# Patient Record
Sex: Male | Born: 1985 | Race: Black or African American | Hispanic: No | Marital: Single | Smoking: Current every day smoker
Health system: Southern US, Community
[De-identification: ages and names within clinical notes are randomized; demographics above are authoritative.]

## PROBLEM LIST (undated history)

## (undated) DIAGNOSIS — R7611 Nonspecific reaction to tuberculin skin test without active tuberculosis: Secondary | ICD-10-CM

## (undated) DIAGNOSIS — B86 Scabies: Secondary | ICD-10-CM

## (undated) HISTORY — DX: Nonspecific reaction to tuberculin skin test without active tuberculosis: R76.11

---
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
## 2006-02-22 ENCOUNTER — Emergency Department (HOSPITAL_COMMUNITY): Admission: EM | Admit: 2006-02-22 | Discharge: 2006-02-22 | Payer: Self-pay | Admitting: Emergency Medicine

## 2006-03-27 ENCOUNTER — Emergency Department (HOSPITAL_COMMUNITY): Admission: EM | Admit: 2006-03-27 | Discharge: 2006-03-27 | Payer: Self-pay | Admitting: Emergency Medicine

## 2006-06-14 ENCOUNTER — Emergency Department (HOSPITAL_COMMUNITY): Admission: EM | Admit: 2006-06-14 | Discharge: 2006-06-14 | Payer: Self-pay | Admitting: Emergency Medicine

## 2006-08-03 ENCOUNTER — Emergency Department (HOSPITAL_COMMUNITY): Admission: EM | Admit: 2006-08-03 | Discharge: 2006-08-03 | Payer: Self-pay | Admitting: *Deleted

## 2006-12-12 ENCOUNTER — Ambulatory Visit (HOSPITAL_COMMUNITY): Admission: RE | Admit: 2006-12-12 | Discharge: 2006-12-12 | Payer: Self-pay | Admitting: Occupational Medicine

## 2007-05-22 ENCOUNTER — Ambulatory Visit: Payer: Self-pay | Admitting: Internal Medicine

## 2007-05-22 ENCOUNTER — Encounter (INDEPENDENT_AMBULATORY_CARE_PROVIDER_SITE_OTHER): Payer: Self-pay | Admitting: *Deleted

## 2007-05-23 LAB — CONVERTED CEMR LAB
ALT: 14 units/L (ref 0–53)
AST: 17 units/L (ref 0–37)
Albumin: 5.2 g/dL (ref 3.5–5.2)
BUN: 16 mg/dL (ref 6–23)
Benzodiazepines.: NEGATIVE
CO2: 22 meq/L (ref 19–32)
Calcium: 10.4 mg/dL (ref 8.4–10.5)
Chloride: 105 meq/L (ref 96–112)
Marijuana Metabolite: POSITIVE — AB
Methadone: NEGATIVE
Opiates: NEGATIVE
Potassium: 4.8 meq/L (ref 3.5–5.3)
Propoxyphene: NEGATIVE

## 2007-05-25 ENCOUNTER — Encounter (INDEPENDENT_AMBULATORY_CARE_PROVIDER_SITE_OTHER): Payer: Self-pay | Admitting: *Deleted

## 2007-05-28 ENCOUNTER — Telehealth (INDEPENDENT_AMBULATORY_CARE_PROVIDER_SITE_OTHER): Payer: Self-pay | Admitting: *Deleted

## 2007-05-30 ENCOUNTER — Encounter: Payer: Self-pay | Admitting: *Deleted

## 2007-05-30 ENCOUNTER — Encounter (INDEPENDENT_AMBULATORY_CARE_PROVIDER_SITE_OTHER): Payer: Self-pay | Admitting: *Deleted

## 2007-05-30 ENCOUNTER — Emergency Department (HOSPITAL_COMMUNITY): Admission: EM | Admit: 2007-05-30 | Discharge: 2007-05-31 | Payer: Self-pay | Admitting: Emergency Medicine

## 2007-12-17 ENCOUNTER — Encounter: Payer: Self-pay | Admitting: Internal Medicine

## 2007-12-17 ENCOUNTER — Ambulatory Visit: Payer: Self-pay | Admitting: Infectious Diseases

## 2007-12-17 LAB — CONVERTED CEMR LAB

## 2008-07-21 ENCOUNTER — Encounter (INDEPENDENT_AMBULATORY_CARE_PROVIDER_SITE_OTHER): Payer: Self-pay | Admitting: *Deleted

## 2008-07-21 ENCOUNTER — Ambulatory Visit: Payer: Self-pay | Admitting: Internal Medicine

## 2008-07-21 DIAGNOSIS — L259 Unspecified contact dermatitis, unspecified cause: Secondary | ICD-10-CM

## 2008-07-21 DIAGNOSIS — F17200 Nicotine dependence, unspecified, uncomplicated: Secondary | ICD-10-CM

## 2008-07-21 DIAGNOSIS — R300 Dysuria: Secondary | ICD-10-CM

## 2008-07-23 LAB — CONVERTED CEMR LAB
Chlamydia, Swab/Urine, PCR: NEGATIVE
GC Probe Amp, Urine: NEGATIVE
Leukocytes, UA: NEGATIVE
Nitrite: NEGATIVE
Protein, ur: NEGATIVE mg/dL
Urobilinogen, UA: 0.2 (ref 0.0–1.0)

## 2009-02-17 ENCOUNTER — Ambulatory Visit: Payer: Self-pay | Admitting: Internal Medicine

## 2009-02-17 DIAGNOSIS — S025XXA Fracture of tooth (traumatic), initial encounter for closed fracture: Secondary | ICD-10-CM | POA: Insufficient documentation

## 2009-02-18 LAB — CONVERTED CEMR LAB: Chlamydia, Swab/Urine, PCR: NEGATIVE

## 2009-07-22 ENCOUNTER — Ambulatory Visit: Payer: Self-pay | Admitting: Infectious Diseases

## 2009-07-22 LAB — CONVERTED CEMR LAB

## 2009-10-10 ENCOUNTER — Emergency Department (HOSPITAL_COMMUNITY): Admission: EM | Admit: 2009-10-10 | Discharge: 2009-10-10 | Payer: Self-pay | Admitting: Family Medicine

## 2009-10-22 ENCOUNTER — Ambulatory Visit: Payer: Self-pay | Admitting: Internal Medicine

## 2009-10-22 LAB — CONVERTED CEMR LAB
Chlamydia, Swab/Urine, PCR: NEGATIVE
GC Probe Amp, Urine: NEGATIVE

## 2010-02-19 ENCOUNTER — Ambulatory Visit: Payer: Self-pay | Admitting: Internal Medicine

## 2010-04-25 ENCOUNTER — Emergency Department (HOSPITAL_COMMUNITY)
Admission: EM | Admit: 2010-04-25 | Discharge: 2010-04-25 | Payer: Self-pay | Source: Home / Self Care | Admitting: Family Medicine

## 2010-06-07 ENCOUNTER — Emergency Department (HOSPITAL_COMMUNITY)
Admission: EM | Admit: 2010-06-07 | Discharge: 2010-06-07 | Payer: Self-pay | Source: Home / Self Care | Admitting: Emergency Medicine

## 2010-06-09 LAB — GC/CHLAMYDIA PROBE AMP, GENITAL
Chlamydia, DNA Probe: NEGATIVE
GC Probe Amp, Genital: NEGATIVE

## 2010-06-17 NOTE — Assessment & Plan Note (Signed)
Summary: EST/NOT HFU/NEEDS ROUTINE CHECKUP/CH   Vital Signs:  Patient profile:   25 year old male Height:      74 inches (187.96 cm) Weight:      166.8 pounds (75.82 kg) BMI:     21.49 Temp:     98.1 degrees F (36.72 degrees C) oral Pulse rate:   64 / minute BP sitting:   113 / 69  (right arm)  Vitals Entered By: Cynda Familia Duncan Dull) (October 22, 2009 3:06 PM) Is Patient Diabetic? No Pain Assessment Patient in pain? no      Nutritional Status BMI of 19 -24 = normal  Have you ever been in a relationship where you felt threatened, hurt or afraid?No   Does patient need assistance? Functional Status Self care Ambulation Normal   History of Present Illness: 25 yr old man with no significant pmhx comes to the clinic for health screening. Patient has no complains.   Preventive Screening-Counseling & Management  Alcohol-Tobacco     Alcohol drinks/day: <1     Alcohol type: beer     Smoking Status: current     Smoking Cessation Counseling: yes     Packs/Day: 5cigs/day     Cans of tobacco/week: no     Passive Smoke Exposure: yes  Problems Prior to Update: 1)  Broken Tooth  (ICD-873.63) 2)  Tobacco User  (ICD-305.1) 3)  Dysuria  (ICD-788.1) 4)  Eczema  (ICD-692.9) 5)  Sexual Activity, High Risk  (ICD-V69.2) 6)  Physical Examination  (ICD-V70.0) 7)  Screening Examination For Venereal Disease  (ICD-V74.5)  Medications Prior to Update: 1)  Hydrocortisone 1 % Lotn (Hydrocortisone) .... Apply Thin Layer To Affected Skin Areas Two To 3 Times/ Day For One Week. 2)  Flagyl 500 Mg Tabs (Metronidazole) .... 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  Past Surgical History: Last updated: 05/22/2007 - None.  Family History: Last updated:  05/22/2007 Mother: 33 (recent stomach surgery) Father: deceased Has 1 younger brother, healthy.  Not aware of family hx of MI, CVA, DM.  Maybe HTN - not sure.  Social History: Last updated: 05/22/2007 Lives with mother in [place].  Smokes 3 cig/day - quit for 5 months, recently restarted during holidays 2/2 passive exposure/hanging out with friends.  Has smoked a total of 3-4 years.  Drinks 2 beers every other week.  Sexually active, but says he always uses condoms.  Denies any illicit drug use.  Risk Factors: Alcohol Use: <1 (10/22/2009) Exercise: yes (02/17/2009)  Risk Factors: Smoking Status: current (10/22/2009) Packs/Day: 5cigs/day (10/22/2009) Cans of tobacco/wk: no (10/22/2009) Passive Smoke Exposure: yes (10/22/2009)  Family History: Reviewed history from 05/22/2007 and no changes required. Mother: 1 (recent stomach surgery) Father: deceased Has 1 younger brother, healthy.  Not aware of family hx of MI, CVA, DM.  Maybe HTN - not sure.  Social History: Reviewed history from 05/22/2007 and no changes required. Lives with mother in [place].  Smokes 3 cig/day - quit for 5 months, recently restarted during holidays 2/2 passive exposure/hanging out with friends.  Has smoked a total of 3-4 years.  Drinks 2 beers every other week.  Sexually active, but says he always uses condoms.  Denies any illicit drug use.Packs/Day:  5cigs/day  Review of Systems  The patient denies  fever, chest pain, dyspnea on exertion, peripheral edema, headaches, hemoptysis, abdominal pain, melena, hematochezia, severe indigestion/heartburn, hematuria, genital sores, muscle weakness, suspicious skin lesions, and difficulty walking.    Physical Exam  General:  NAD Mouth:  MMM Neck:  supple.   Lungs:  Normal respiratory effort, chest expands symmetrically. Lungs are clear to auscultation, no crackles or wheezes. Heart:  Normal rate and regular rhythm. S1 and S2 normal without gallop, murmur,  click, rub or other extra sounds. Abdomen:  soft, non-tender, and normal bowel sounds.   Msk:  normal ROM.   Extremities:  No clubbing, cyanosis, edema, or deformity noted with normal full range of motion of all joints.   Neurologic:  No cranial nerve deficits noted. Station and gait are normal. Plantar reflexes are down-going bilaterally. DTRs are symmetrical throughout. Sensory, motor and coordinative functions appear intact.   Impression & Recommendations:  Problem # 1:  SEXUAL ACTIVITY, HIGH RISK (ICD-V69.2) Patient here for usual health screening. No discharge, skin lesions, genital sores, or disuria. Review labs and reasses.  Orders: T-Chlamydia & GC Probe, Urine ([id number]) T-HIV Antibody  (Reflex) ([id number])  Patient Instructions: 1)  Please schedule a follow-up appointment as needed. 2)  You will be called with any abnormalities in the tests scheduled or performed today.  If you don't hear from [place] within a week from when the test was performed, you can assume that your test was normal.  Process Orders Check Orders Results:     [facility]: ABN not required for this insurance Tests Sent for requisitioning (October 22, 2009 3:48 PM):     10/22/2009: [facility] -- T-Chlamydia & GC Probe, Urine [[id number]] (signed)     10/22/2009: [facility] -- T-HIV Antibody  (Reflex) [[id number]] (signed)    Prevention & Chronic Care Immunizations   Influenza vaccine: Not documented    Tetanus booster: Not documented    Pneumococcal vaccine: Not documented  Other Screening   Smoking status: current  (10/22/2009)   Smoking cessation counseling: yes  (10/22/2009)
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
## 2010-06-17 NOTE — Assessment & Plan Note (Signed)
Summary: EST-ROUTINE CHECKUP/CH   Vital Signs:  Patient profile:   25 year old male Weight:      174.0 pounds (79.09 kg) BMI:     22.42 Pulse rate:   54 / minute BP sitting:   128 / 73  (left arm) Cuff size:   regular  Vitals Entered By: Cynda Familia Duncan Dull) (February 19, 2010 4:58 PM)  History of Present Illness: 25 yr old man with pmhx as described below comes to the clinic reportst that he has exposure to clamyida but has not had any symptoms. This occured on last monday. 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  Past Surgical History: Last updated: 05/22/2007 - None.  Family History: Last updated: 05/22/2007 Mother: 76 (recent stomach surgery) Father: deceased Has 1 younger brother, healthy.  Not aware of family hx of MI, CVA, DM.  Maybe HTN - not sure.  Social History: Last updated: 05/22/2007 Lives with mother in [place].  Smokes 3 cig/day - quit for 5 months, recently restarted during holidays 2/2 passive exposure/hanging out with friends.  Has smoked a total of 3-4 years.  Drinks 2 beers every other week.  Sexually active, but says he always uses condoms.  Denies any illicit drug use.  Risk Factors: Alcohol Use: <1 (10/22/2009) Exercise: yes (02/17/2009)  Risk Factors: Smoking Status: current (10/22/2009) Packs/Day: 5cigs/day (10/22/2009) Cans of tobacco/wk: no (10/22/2009) Passive Smoke Exposure: yes  (10/22/2009)  Family History: Reviewed history from 05/22/2007 and no changes required. Mother: 16 (recent stomach surgery) Father: deceased Has 1 younger brother, healthy.  Not aware of family hx of MI, CVA, DM.  Maybe HTN - not sure.  Social History: Reviewed history from 05/22/2007 and no changes required. Lives with mother in [place].  Smokes 3 cig/day - quit for 5 months, recently restarted during holidays 2/2 passive exposure/hanging out with friends.  Has smoked a total of 3-4 years.  Drinks 2 beers every other week.  Sexually active, but says he always uses condoms.  Denies any illicit drug use.  Review of Systems  The patient denies fever, chest pain, dyspnea on exertion, headaches, hemoptysis, abdominal pain, melena, hematochezia, hematuria, muscle weakness, and difficulty walking.    Physical Exam  General:  NAD Mouth:  MMM Neck:  supple.   Lungs:  Normal respiratory effort, chest expands symmetrically. Lungs are clear to auscultation, no crackles or wheezes. Heart:  Normal rate and regular rhythm. S1 and S2 normal without gallop, murmur, click, rub or other extra sounds. Abdomen:  soft, non-tender, and normal bowel sounds.   Msk:  normal ROM.   Extremities:  No clubbing, cyanosis, edema, or deformity noted with normal full range of motion of all joints.   Neurologic:  No cranial nerve deficits noted. Station and gait are normal. Plantar reflexes are down-going bilaterally. DTRs are symmetrical throughout. Sensory, motor and coordinative functions appear intact.   Impression & Recommendations:  Problem # 1:  SEXUAL ACTIVITY, HIGH RISK (  ICD-V69.2) Patient had contanct with chlamydia although he has no symptoms. Will treat with a course of Doxycyline for 7 days. Patient was counselled on safe sex practices.   Problem # 2:  TOBACCO USER (ICD-305.1) Encouraged smoking cessation and discussed different methods for smoking cessation.   Complete Medication List: 1)   Doxycycline Hyclate 100 Mg Caps (Doxycycline hyclate) .... Take 1 tablet by mouth two times a day for 7 days  Patient Instructions: 1)  Please schedule a follow-up appointment in 6 months. 2)  Take medicaiton as directed.  Prescriptions: DOXYCYCLINE HYCLATE 100 MG CAPS (DOXYCYCLINE HYCLATE) Take 1 tablet by mouth two times a day for 7 days  #14 x 0   Entered and Authorized by:   Laren Everts MD   Signed by:   Laren Everts MD on 02/19/2010   Method used:   Print then Give to Patient   RxID:   [id number]

## 2010-08-02 LAB — GC/CHLAMYDIA PROBE AMP, GENITAL: GC Probe Amp, Genital: NEGATIVE

## 2010-10-08 ENCOUNTER — Encounter: Payer: Self-pay | Admitting: Internal Medicine

## 2011-02-02 LAB — I-STAT 8, (EC8 V) (CONVERTED LAB)
Acid-Base Excess: 2
Chloride: 106
HCT: 48
Hemoglobin: 16.3
Operator id: 234501
Potassium: 7
pH, Ven: 7.399 — ABNORMAL HIGH

## 2011-02-02 LAB — BASIC METABOLIC PANEL
BUN: 9
CO2: 26
Calcium: 9.2
Creatinine, Ser: 1.2
Glucose, Bld: 59 — ABNORMAL LOW

## 2011-07-13 ENCOUNTER — Emergency Department (INDEPENDENT_AMBULATORY_CARE_PROVIDER_SITE_OTHER)
Admission: EM | Admit: 2011-07-13 | Discharge: 2011-07-13 | Disposition: A | Discharge status: Home / Self Care | Payer: Self-pay | Source: Home / Self Care | Attending: Family Medicine | Admitting: Family Medicine

## 2011-07-13 ENCOUNTER — Encounter (HOSPITAL_COMMUNITY): Payer: Self-pay | Admitting: *Deleted

## 2011-07-13 DIAGNOSIS — N342 Other urethritis: Secondary | ICD-10-CM

## 2011-07-13 MED ORDER — AZITHROMYCIN 250 MG PO TABS
ORAL_TABLET | ORAL | Status: AC
  Filled 2011-07-13: qty 4

## 2011-07-13 MED ORDER — AZITHROMYCIN 250 MG PO TABS
1000.0000 mg | ORAL_TABLET | Freq: Every day | ORAL | Status: DC
  Administered 2011-07-13 (×2): 1000 mg via ORAL

## 2011-07-13 MED ORDER — CEFTRIAXONE SODIUM 1 G IJ SOLR
250.0000 mg | Freq: Once | INTRAMUSCULAR | Status: AC
  Administered 2011-07-13: 250 mg via INTRAMUSCULAR

## 2011-07-13 MED ORDER — CEFTRIAXONE SODIUM 250 MG IJ SOLR
INTRAMUSCULAR | Status: AC
  Filled 2011-07-13: qty 250

## 2011-07-13 NOTE — Discharge Instructions (Signed)
We will call with test results and treat as indicated °

## 2011-07-13 NOTE — ED Provider Notes (Signed)
History     CSN: [id number]  Arrival date & time 07/13/11  1109   First MD Initiated Contact with Patient 07/13/11 1327      Chief Complaint  Patient presents with  . SEXUALLY TRANSMITTED DISEASE    (Consider location/radiation/quality/duration/timing/severity/associated sxs/prior treatment) Patient is a 26 y.o. male presenting with STD exposure. The history is provided by the patient.  Exposure to STD This is a new problem. The current episode started 6 to 12 hours ago. The problem has not changed since onset.Associated symptoms comments: Denies d/c, has had unprotected sex.. Exacerbated by: dysuria with urination.    Past Medical History  Diagnosis Date  . PPD positive, treated     7/208 upon hire at [facility]; received 9 months isoniazid+vit B6 therapy    History reviewed. No pertinent past surgical history.  No family history on file.  History  Substance Use Topics  . Smoking status: Current Everyday Smoker  . Smokeless tobacco: Not on file  . Alcohol Use: Yes     2 beers every other week.      Review of Systems  Constitutional: Negative.   Genitourinary: Positive for dysuria. Negative for urgency, discharge, penile swelling, scrotal swelling, genital sores, penile pain and testicular pain.    Allergies  Review of patient's allergies indicates no known allergies.  Home Medications  No current outpatient prescriptions on file.  BP 120/70  Pulse 60  Temp(Src) 98.6 F (37 C) (Oral)  Resp 14  SpO2 99%  Physical Exam  Nursing note and vitals reviewed. Constitutional: He appears well-developed and well-nourished.  Abdominal: Soft. Bowel sounds are normal.  Genitourinary: Penis normal. No penile tenderness.    ED Course  Procedures (including critical care time)   Labs Reviewed  GC/CHLAMYDIA PROBE AMP, GENITAL   No results found.   1. Urethritis       MDM         Barkley Bruns, MD 07/13/11 1415

## 2011-07-13 NOTE — ED Notes (Signed)
Pt  Reports  He  Woke  This  Am  With a  Burning  Sensation  When he  Urinates      He  denys  Any obvious  Discharge  Or  Any  Sores     He  Reports  He  Has  However       Had  Unprotected  Sex          Within the  Last  Several  Weeks

## 2012-10-20 ENCOUNTER — Emergency Department (INDEPENDENT_AMBULATORY_CARE_PROVIDER_SITE_OTHER)
Admission: EM | Admit: 2012-10-20 | Discharge: 2012-10-20 | Disposition: A | Discharge status: Home / Self Care | Payer: Self-pay | Source: Home / Self Care

## 2012-10-20 ENCOUNTER — Encounter (HOSPITAL_COMMUNITY): Payer: Self-pay | Admitting: Emergency Medicine

## 2012-10-20 ENCOUNTER — Emergency Department (INDEPENDENT_AMBULATORY_CARE_PROVIDER_SITE_OTHER): Payer: Self-pay

## 2012-10-20 DIAGNOSIS — B86 Scabies: Secondary | ICD-10-CM

## 2012-10-20 DIAGNOSIS — R0982 Postnasal drip: Secondary | ICD-10-CM

## 2012-10-20 DIAGNOSIS — S0003XA Contusion of scalp, initial encounter: Secondary | ICD-10-CM

## 2012-10-20 DIAGNOSIS — S1093XA Contusion of unspecified part of neck, initial encounter: Secondary | ICD-10-CM

## 2012-10-20 MED ORDER — PERMETHRIN 5 % EX CREA: TOPICAL_CREAM | CUTANEOUS | Status: DC

## 2012-10-20 NOTE — ED Notes (Signed)
Throat Culture sent to lab with manual request.  

## 2012-10-20 NOTE — ED Provider Notes (Signed)
Medical screening examination/treatment/procedure(s) were performed by non-physician practitioner and as supervising physician I was immediately available for consultation/collaboration.  Pryce Folts, M.D.  Jett Kulzer C Georgianna Band, MD 10/20/12 1922 

## 2012-10-20 NOTE — ED Notes (Signed)
Pt c/o sore throat onset 5/29... Reports he was play wrestling and was hit at throat Since than, he reports odynophagia when he eats/drinks Denies: fevers Also reports of rash on arms... Worst at night... Brother dx w/scabies. He is alert and oriented w/no signs of acute distress.
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
## 2012-10-22 LAB — CULTURE, GROUP A STREP

## 2012-11-12 ENCOUNTER — Emergency Department (INDEPENDENT_AMBULATORY_CARE_PROVIDER_SITE_OTHER)
Admission: EM | Admit: 2012-11-12 | Discharge: 2012-11-12 | Disposition: A | Discharge status: Home / Self Care | Payer: Self-pay | Source: Home / Self Care | Attending: Emergency Medicine | Admitting: Emergency Medicine

## 2012-11-12 DIAGNOSIS — M25569 Pain in unspecified knee: Secondary | ICD-10-CM

## 2012-11-12 MED ORDER — MELOXICAM 7.5 MG PO TABS: 7.5000 mg | ORAL_TABLET | Freq: Every day | ORAL | Status: DC

## 2012-11-12 NOTE — ED Provider Notes (Signed)
History    CSN: [id number] Arrival date & time 11/12/12  1241  First MD Initiated Contact with Patient 11/12/12 1357     Chief Complaint  Patient presents with  . Knee Pain   (Consider location/radiation/quality/duration/timing/severity/associated sxs/prior Treatment) HPI Comments: Patient presents to urgent care complaining of bilateral knee pain this started Saturday. 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 He described that Saturday morning he woke up with severe pain mainly on the posterior aspect of his left knee. He describes he feels the knee most a bit " backwards when he walks". He also describes some discomfort and pain on the posterior aspect of his right knee more toward his upper leg with some type of shooting pain. He denies any falls, injuries or twisting motion that cause the pain. He goes into describing all the different types of exercises he performs on a daily basis. Patient denies having sustained any previous fractures, meniscal tears or ligament tears of both of his knees. It hurts to walk and also when he extends and flexes his knee.  Patient denies any constitutional symptoms such as fevers, chills malaise arthralgias myalgias or paresthesias.  Patient is a 27 y.o. male presenting with knee pain.  Knee Pain Location:  Knee Injury: no   Knee location:  R knee and L knee Pain details:    Quality:  Aching   Severity:  Moderate   Timing:  Constant Chronicity:  New Foreign body present:  No foreign bodies Worsened by:  Activity, bearing weight, exercise, extension and flexion Ineffective treatments:  None tried Associated symptoms: stiffness   Associated symptoms: no back pain, no fatigue, no fever, no itching and no numbness   Risk factors: no frequent fractures and no recent illness    Past Medical History  Diagnosis Date  . PPD positive, treated     7/208 upon hire at [facility]; received 9 months isoniazid+vit B6  therapy   No past surgical history on file. No family history on file. History  Substance Use Topics  . Smoking status: Current Every Day Smoker  . Smokeless tobacco: Not on file  . Alcohol Use: Yes     Comment: 2 beers every other week.    Review of Systems  Constitutional: Negative for fever, chills, appetite change and fatigue.  Musculoskeletal: Positive for arthralgias and stiffness. Negative for myalgias, back pain and joint swelling.  Skin: Negative for color change, itching, pallor and rash.  Neurological: Negative for weakness and numbness.    Allergies  Review of patient's allergies indicates no known allergies.  Home Medications   Current Outpatient Rx  Name  Route  Sig  Dispense  Refill  . meloxicam (MOBIC) 7.5 MG tablet   Oral   Take 1 tablet (7.5 mg total) by mouth daily. Take one tablet daily for 2 weeks   14 tablet   0   . permethrin (ELIMITE) 5 % cream      Apply to affected area once   60 g   0    BP 126/82  Pulse 50  Temp(Src) 98.4 F (36.9 C) (Oral)  Resp 14  SpO2 100% Physical Exam  Nursing note and vitals reviewed. Constitutional: He appears well-developed and well-nourished.  Musculoskeletal: He exhibits tenderness. He exhibits no edema.       Left knee: He exhibits abnormal patellar mobility. He exhibits normal range of motion, no swelling, no effusion, no ecchymosis, no deformity, no laceration, no erythema, normal  alignment, no bony tenderness, normal meniscus and no MCL laxity. No tenderness found. No medial joint line, no lateral joint line, no MCL, no LCL and no patellar tendon tenderness noted.       Legs: Neurological: He is alert.  Skin: No rash noted. No erythema.    ED Course  Procedures (including critical care time) Labs Reviewed - No data to display No results found. 1. Knee joint pain, unspecified laterality     MDM  Non-trauma- induced bilateral knee pain. Knee exam was not consistent with a knee or patellar  tendinitis, or a popliteal pathology such as a cyst or vascular injury. On exam, there was a suspicion of a minimal increased laxity of his left knee posteriorly. Patient has been placed on a bilateral knee sleeve encouraged and instructed to avoid strenuous physical activity including lifting weights and exercising for the next 5 days. He was also prescribed a course of meloxicam for 2 weeks and encouraged and instructed to followup with the orthopedic Dr. if discomfort worsens or if pain persists beyond 10-14 days. Work restriction note was given for patient to limit lifting and physical activity for the next 3 days, was explained that if worsening pain or pain restricts his job activities to followup with the orthopedic Dr. sooner for further evaluation.   Jimmie Molly, MD 11/12/12 [phone]

## 2012-11-12 NOTE — ED Notes (Signed)
Complaining of bilateral knee pain which started Saturday morning around 4 am.   Patient has tried icy hot and advil but no relief.  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   The right knee is shooting pain towards back of knee.

## 2013-06-30 ENCOUNTER — Encounter (HOSPITAL_COMMUNITY): Payer: Self-pay | Admitting: Emergency Medicine

## 2013-06-30 ENCOUNTER — Emergency Department (INDEPENDENT_AMBULATORY_CARE_PROVIDER_SITE_OTHER)
Admission: EM | Admit: 2013-06-30 | Discharge: 2013-06-30 | Disposition: A | Discharge status: Home / Self Care | Payer: Self-pay | Source: Home / Self Care | Attending: Family Medicine | Admitting: Family Medicine

## 2013-06-30 DIAGNOSIS — A6002 Herpesviral infection of other male genital organs: Secondary | ICD-10-CM

## 2013-06-30 DIAGNOSIS — A6000 Herpesviral infection of urogenital system, unspecified: Secondary | ICD-10-CM

## 2013-06-30 HISTORY — DX: Scabies: B86

## 2013-06-30 MED ORDER — VALACYCLOVIR HCL 1 G PO TABS: 1000.0000 mg | ORAL_TABLET | Freq: Two times a day (BID) | ORAL | Status: DC

## 2013-06-30 NOTE — ED Notes (Addendum)
States was treated for scabies in 03/2013 - used Permethrin with improvement.  Started with few tiny bumps again in anal area approx 1-2 wks ago; few other sporadic spots have popped up on arms - put Permethrin cream on areas; few days later noticed rash to scrotum - now scrotum is "raw".  Started with right groin pain 3 days ago - denies any bulges or lumps.

## 2013-06-30 NOTE — ED Provider Notes (Signed)
CSN: [id number]     Arrival date & time 06/30/13  1507 History   First MD Initiated Contact with Patient 06/30/13 1518     Chief Complaint  Patient presents with  . Rash     (Consider location/radiation/quality/duration/timing/severity/associated sxs/prior Treatment) Patient is a 28 y.o. male presenting with rash. The history is provided by the patient.  Rash Location:  Ano-genital Ano-genital rash location:  Scrotum Quality: blistering and painful   Pain details:    Severity:  Mild   Duration:  2 days Severity:  Mild Chronicity:  New Worsened by:  Nothing tried Ineffective treatments:  None tried   Past Medical History  Diagnosis Date  . PPD positive, treated     7/208 upon hire at [facility]; received 9 months isoniazid+vit B6 therapy  . Scabies    History reviewed. No pertinent past surgical history. No family history on file. History  Substance Use Topics  . Smoking status: Former Games developermoker  . Smokeless tobacco: Not on file  . Alcohol Use: Yes     Comment: occasional    Review of Systems  Skin: Positive for rash.  Hematological: Positive for adenopathy.      Allergies  Review of patient's allergies indicates no known allergies.  Home Medications   Current Outpatient Rx  Name  Route  Sig  Dispense  Refill  . meloxicam (MOBIC) 7.5 MG tablet   Oral   Take 1 tablet (7.5 mg total) by mouth daily. Take one tablet daily for 2 weeks   14 tablet   0   . permethrin (ELIMITE) 5 % cream      Apply to affected area once   60 g   0   . valACYclovir (VALTREX) 1000 MG tablet   Oral   Take 1 tablet (1,000 mg total) by mouth 2 (two) times daily.   20 tablet   0    BP 141/80  Pulse 61  Temp(Src) 98.5 F (36.9 C) (Oral)  Resp 16  SpO2 100% Physical Exam  Nursing note and vitals reviewed. Constitutional: He is oriented to person, place, and time. He appears well-developed and well-nourished.  Abdominal: Soft. Bowel sounds are normal.  Neurological: He is  alert and oriented to person, place, and time.  Skin: Skin is warm and dry.  Grouped open dry vesicular patchy rash on right scrotum with tender right inguinal nodes. Pt has reported vigorously scrubbing rash.    ED Course  Procedures (including critical care time) Labs Review Labs Reviewed - No data to display Imaging Review No results found.    MDM   Final diagnoses:  Herpes genitalis in men       Linna HoffJames D Roxy Mastandrea, MD 06/30/13 1606

## 2015-11-11 ENCOUNTER — Emergency Department (HOSPITAL_COMMUNITY)
Admission: EM | Admit: 2015-11-11 | Discharge: 2015-11-11 | Disposition: A | Discharge status: Home / Self Care | Payer: Self-pay | Attending: Emergency Medicine | Admitting: Emergency Medicine

## 2015-11-11 ENCOUNTER — Encounter (HOSPITAL_COMMUNITY): Payer: Self-pay | Admitting: Emergency Medicine

## 2015-11-11 DIAGNOSIS — Y9239 Other specified sports and athletic area as the place of occurrence of the external cause: Secondary | ICD-10-CM | POA: Insufficient documentation

## 2015-11-11 DIAGNOSIS — T63301A Toxic effect of unspecified spider venom, accidental (unintentional), initial encounter: Secondary | ICD-10-CM

## 2015-11-11 DIAGNOSIS — Y9389 Activity, other specified: Secondary | ICD-10-CM | POA: Insufficient documentation

## 2015-11-11 DIAGNOSIS — S60462A Insect bite (nonvenomous) of right middle finger, initial encounter: Secondary | ICD-10-CM | POA: Insufficient documentation

## 2015-11-11 DIAGNOSIS — W57XXXA Bitten or stung by nonvenomous insect and other nonvenomous arthropods, initial encounter: Secondary | ICD-10-CM | POA: Insufficient documentation

## 2015-11-11 DIAGNOSIS — Y999 Unspecified external cause status: Secondary | ICD-10-CM | POA: Insufficient documentation

## 2015-11-11 DIAGNOSIS — Z87891 Personal history of nicotine dependence: Secondary | ICD-10-CM | POA: Insufficient documentation

## 2015-11-11 MED ORDER — DOXYCYCLINE HYCLATE 100 MG PO CAPS: 100.0000 mg | ORAL_CAPSULE | Freq: Two times a day (BID) | ORAL | Status: DC

## 2015-11-11 MED ORDER — TETANUS-DIPHTH-ACELL PERTUSSIS 5-2.5-18.5 LF-MCG/0.5 IM SUSP
0.5000 mL | Freq: Once | INTRAMUSCULAR | Status: AC
  Administered 2015-11-11: 0.5 mL via INTRAMUSCULAR
  Filled 2015-11-11: qty 0.5

## 2015-11-11 NOTE — ED Notes (Addendum)
Pt states about 40 mins ago he reached under a bench and felt a sting and seen a "brown spider". Pts right middle finger is swollen at this time.

## 2015-11-11 NOTE — Discharge Instructions (Signed)
Ice and elevate your finger to help with pain and swelling, you can also consider applying warm compresses to affected area throughout the day to help with pain. Use tylenol or motrin for pain. If the finger becomes more swollen, red and hot to touch, with drainage/pus or you develop fevers, then start taking the antibiotic; however if your symptoms don't worsen then you don't need to take the antibiotic. [facility] or [facility] in 3-4  days for wound recheck, and to [facility] care at [facility]. May consider using benadryl or antihistamines to help with swelling/itching. Monitor area for signs of infection to include, but not limited to: increasing pain, spreading redness, drainage/pus, worsening swelling, or fevers. Return to emergency department for emergent changing or worsening symptoms (SEE THE ATTACHMENTS BELOW FOR THE SIGNS/SYMPTOMS OF DEEP FINGER INFECTION THAT WOULD INDICATE THAT YOU NEED TO COME BACK TO THE EMERGENCY DEPARTMENT IMMEDIATELY).    Spider Bite Spider bites are not common. Most spider bites do not cause serious problems. There are only a few types of spider bites that can cause serious health problems. HOME CARE Medicine  Take or apply over-the-counter and prescription medicines only as told by your doctor.  If you were given an antibiotic medicine, take or apply it as told by your doctor. Do not stop using the antibiotic even if your condition improves. General Instructions  Do not scratch the bite area.  Keep the bite area clean and dry. Wash the bite area with soap and water every day as told by your doctor.  If directed, apply ice to the bite area.  Put ice in a plastic bag.  Place a towel between your skin and the bag.  Leave the ice on for 20 minutes, 2-3 times per day.  Raise (elevate) the affected area above the level of your heart while you are sitting or lying down, if this is possible.  Keep all  follow-up visits as told by your doctor. This is important. GET HELP IF:  Your bite does not get better after 3 days.  Your bite turns black or purple.  Near the bite, you have:  Redness.  Swelling (inflammation).  Pain that is getting worse. GET HELP RIGHT AWAY IF:  You get shortness of breath or chest pain.  You have fluid, blood, or pus coming from the bite area.  You have muscle cramps or painful muscle spasms.  You have stomach (abdominal) pain.  You feel sick to your stomach (nauseous) or you throw up (vomit).  You feel more tired or sleepy than you normally do.   This information is not intended to replace advice given to you by your health care provider. Make sure you discuss any questions you have with your health care provider.   Document Released: 06/04/2010 Document Revised: 01/21/2015 Document Reviewed: 09/17/2014 Elsevier Interactive Patient Education [employer]2016 Elsevier Inc.

## 2015-11-11 NOTE — ED Provider Notes (Signed)
CSN: [id number]     Arrival date & time 11/11/15  2009 History  By signing my name below, I, Harry Barber, attest that this documentation has been prepared under the direction and in the presence of Nasiya Pascual Camprubi-Soms, PA-C. Electronically Signed: Placido SouLogan Barber, ED Scribe. 11/11/2015. 9:23 PM.   Chief Complaint  Patient presents with  . Insect Bite   Patient is a 30 y.o. male presenting with rash. The history is provided by the patient. No language interpreter was used.  Rash Location:  Hand Hand rash location:  R hand Quality: redness and swelling   Quality: not itchy and not weeping   Severity:  Mild Onset quality:  Sudden Duration:  2 hours Timing:  Constant Progression:  Partially resolved Chronicity:  New Context: insect bite/sting   Relieved by:  Topical steroids (ice) Worsened by:  Nothing tried Ineffective treatments:  None tried Associated symptoms: myalgias (R middle finger)   Associated symptoms: no abdominal pain, no diarrhea, no fever, no joint pain, no nausea, no shortness of breath, no throat swelling, no tongue swelling, not vomiting and not wheezing     HPI Comments: Harry Barber is a 30 y.o. male who is otherwise healthy presents to the Emergency Department complaining of 4/10, constant, stinging, nonradiating, right middle finger pain x 1.5 hours. 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 He states that 15 minutes later he began experiencing mild redness, swelling, and warmth. He states his redness has alleviated and his pain has mildly alleviated. Pt applied ice and hydrocortisone cream to the region which provided mild relief. Gripping and moving the finger makes the pain worse. Pt has no known drug allergies and denies having ever been stung in the past and experienced an allergic reaction. Pt denies a PMHx of HIV or DM2. Pt is unsure of the timing  of his most recent tetanus vaccination. He denies itchiness, loss of ROM, red streaking, drainage, fevers, chills, CP, SOB, oral swelling, difficulty swallowing, trismus, drooling, wheezing, abd pain, n/v/d/c, dysuria, hematuria, numbness, ongoing tingling, and weakness. No PCP at this time.   Past Medical History  Diagnosis Date  . PPD positive, treated     7/208 upon hire at [facility]; received 9 months isoniazid+vit B6 therapy  . Scabies    History reviewed. No pertinent past surgical history. No family history on file. Social History  Substance Use Topics  . Smoking status: Former Games developermoker  . Smokeless tobacco: None  . Alcohol Use: Yes     Comment: occasional    Review of Systems  Constitutional: Negative for fever and chills.  HENT: Negative for drooling, facial swelling and trouble swallowing.   Respiratory: Negative for shortness of breath and wheezing.   Cardiovascular: Negative for chest pain.  Gastrointestinal: Negative for nausea, vomiting, abdominal pain, diarrhea and constipation.  Genitourinary: Negative for dysuria and hematuria.  Musculoskeletal: Positive for myalgias (R middle finger) and joint swelling (R middle finger). Negative for arthralgias.  Skin: Positive for rash and wound (R middle finger spider bite). Negative for color change.  Allergic/Immunologic: Negative for immunocompromised state.  Neurological: Negative for weakness and numbness.  Psychiatric/Behavioral: Negative for confusion.   A complete 10 system review of systems was obtained and all systems are negative except as noted in the HPI and PMH.   Allergies  Review of patient's allergies indicates no known allergies.  Home Medications   Prior to Admission medications   Medication Sig Start Date End Date Taking? Authorizing Provider  meloxicam (MOBIC) 7.5 MG tablet Take 1 tablet (7.5 mg total) by mouth daily. Take one tablet daily for 2 weeks 11/12/12   Jimmie Molly, MD  permethrin (ELIMITE) 5 %  cream Apply to affected area once 10/20/12   Hayden Rasmussen, NP  valACYclovir (VALTREX) 1000 MG tablet Take 1 tablet (1,000 mg total) by mouth 2 (two) times daily. 06/30/13   Linna Hoff, MD   BP 148/83 mmHg  Pulse 60  Temp(Src) 98.3 F (36.8 C) (Oral)  Resp 18  Ht  (1.88 m)  Wt 194 lb 1 oz (88.026 kg)  BMI 24.91 kg/m2  SpO2 100%    Physical Exam  Constitutional: He is oriented to person, place, and time. Vital signs are normal. He appears well-developed and well-nourished.  Non-toxic appearance. No distress.  Afebrile, nontoxic, NAD  HENT:  Head: Normocephalic and atraumatic.  Mouth/Throat: Mucous membranes are normal.  No facial swelling, airway patent.   Eyes: Conjunctivae and EOM are normal. Right eye exhibits no discharge. Left eye exhibits no discharge.  Neck: Normal range of motion. Neck supple.  Cardiovascular: Normal rate and intact distal pulses.   Pulmonary/Chest: Effort normal. No respiratory distress.  Abdominal: Normal appearance. He exhibits no distension.  Musculoskeletal: Normal range of motion.       Right hand: He exhibits swelling (middle finger). He exhibits normal range of motion, no tenderness, no bony tenderness, normal two-point discrimination, normal capillary refill, no deformity and no laceration. Normal sensation noted. Normal strength noted.  Right hand with FROM intact in all digits, with mild swelling to the middle finger, no erythema or puncture mark, no warmth, no focal abscess or evidence of cellulitis, without focal TTP, strength and sensation grossly intact, distal pulses intact, soft compartments.   Neurological: He is alert and oriented to person, place, and time. He has normal strength. No sensory deficit.  Skin: Skin is warm, dry and intact. No rash noted.  Right middle finger exam as noted above. No other rashes noted.   Psychiatric: He has a normal mood and affect.  Nursing note and vitals reviewed.   ED Course  Procedures  DIAGNOSTIC  STUDIES: Oxygen Saturation is 100% on RA, normal by my interpretation.    COORDINATION OF CARE: 9:20 PM Discussed next steps with pt. Pt verbalized understanding and is agreeable with the plan.   Labs Review Labs Reviewed - No data to display  Imaging Review No results found.   EKG Interpretation None      MDM   Final diagnoses:  Spider bite, accidental or unintentional, initial encounter    30 y.o. male with spider bite to R middle finger, some mild swelling noted. He reports redness that resolved. No warmth/induration, no evidence of infection/felon/flexor tenosynovitis. ROM preserved, and no focal tenderness. NVI with soft compartments. Discussed that it could just be a mild allergic reaction, but since spider bites can occasionally lead to infx, will give safety script. Strict guidelines for when to take this discussed. Benadryl/antihistamines for itching/swelling. RICE/tylenol/motrin discussed. F/up with [facility] or [facility] in 3-4 days for recheck, and with [facility] in 1wk to recheck and establish medical care. Updated tetanus today. I explained the diagnosis and have given explicit precautions to return to the ER including for any other new or worsening symptoms. The patient understands and accepts the medical plan as it's been dictated and I have answered their  questions. Discharge instructions concerning home care and prescriptions have been given. The patient is STABLE and is discharged to home in good condition.   I personally performed the services described in this documentation, which was scribed in my presence. The recorded information has been reviewed and is accurate.  BP 148/83 mmHg  Pulse 60  Temp(Src) 98.3 F (36.8 C) (Oral)  Resp 18  Ht 6\' 2"  (1.88 m)  Wt 88.026 kg  BMI 24.91 kg/m2  SpO2 100%  Meds ordered this encounter  Medications  . Tdap (BOOSTRIX) injection 0.5 mL    Sig:   . doxycycline (VIBRAMYCIN) 100 MG capsule    Sig: Take 1 capsule (100 mg total) by mouth  2 (two) times daily. One po bid x 7 days. ONLY START TAKING IF SYMPTOMS WORSEN    Dispense:  14 capsule    Refill:  0    Order Specific Question:  Supervising Provider    Answer:  Eber HongMILLER, BRIAN [[id number]]      Delshawn Stech Camprubi-Soms, PA-C 11/11/15 2131  Gwyneth SproutWhitney Plunkett, MD 11/13/15 1451

## 2016-06-28 ENCOUNTER — Emergency Department (HOSPITAL_COMMUNITY)
Admission: EM | Admit: 2016-06-28 | Discharge: 2016-06-28 | Disposition: A | Discharge status: Home / Self Care | Payer: Self-pay | Attending: Emergency Medicine | Admitting: Emergency Medicine

## 2016-06-28 ENCOUNTER — Encounter (HOSPITAL_COMMUNITY): Payer: Self-pay

## 2016-06-28 DIAGNOSIS — Y92009 Unspecified place in unspecified non-institutional (private) residence as the place of occurrence of the external cause: Secondary | ICD-10-CM | POA: Insufficient documentation

## 2016-06-28 DIAGNOSIS — T2010XA Burn of first degree of head, face, and neck, unspecified site, initial encounter: Secondary | ICD-10-CM

## 2016-06-28 DIAGNOSIS — T310 Burns involving less than 10% of body surface: Secondary | ICD-10-CM | POA: Insufficient documentation

## 2016-06-28 DIAGNOSIS — T2012XA Burn of first degree of lip(s), initial encounter: Secondary | ICD-10-CM | POA: Insufficient documentation

## 2016-06-28 DIAGNOSIS — X102XXA Contact with fats and cooking oils, initial encounter: Secondary | ICD-10-CM | POA: Insufficient documentation

## 2016-06-28 DIAGNOSIS — Z79899 Other long term (current) drug therapy: Secondary | ICD-10-CM | POA: Insufficient documentation

## 2016-06-28 DIAGNOSIS — T23222A Burn of second degree of single left finger (nail) except thumb, initial encounter: Secondary | ICD-10-CM | POA: Insufficient documentation

## 2016-06-28 DIAGNOSIS — Y93G9 Activity, other involving cooking and grilling: Secondary | ICD-10-CM | POA: Insufficient documentation

## 2016-06-28 DIAGNOSIS — Y999 Unspecified external cause status: Secondary | ICD-10-CM | POA: Insufficient documentation

## 2016-06-28 MED ORDER — IBUPROFEN 600 MG PO TABS: 600.0000 mg | ORAL_TABLET | Freq: Four times a day (QID) | ORAL | 0 refills | Status: DC | PRN

## 2016-06-28 MED ORDER — HYDROCODONE-ACETAMINOPHEN 5-325 MG PO TABS: 1.0000 | ORAL_TABLET | ORAL | 0 refills | Status: DC | PRN
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
## 2017-02-19 ENCOUNTER — Emergency Department (HOSPITAL_COMMUNITY): Payer: No Typology Code available for payment source

## 2017-02-19 ENCOUNTER — Encounter (HOSPITAL_COMMUNITY): Payer: Self-pay | Admitting: Emergency Medicine

## 2017-02-19 ENCOUNTER — Emergency Department (HOSPITAL_COMMUNITY)
Admission: EM | Admit: 2017-02-19 | Discharge: 2017-02-19 | Disposition: A | Discharge status: Home / Self Care | Payer: No Typology Code available for payment source | Attending: Emergency Medicine | Admitting: Emergency Medicine

## 2017-02-19 DIAGNOSIS — S4992XA Unspecified injury of left shoulder and upper arm, initial encounter: Secondary | ICD-10-CM | POA: Diagnosis present

## 2017-02-19 DIAGNOSIS — Y92410 Unspecified street and highway as the place of occurrence of the external cause: Secondary | ICD-10-CM | POA: Insufficient documentation

## 2017-02-19 DIAGNOSIS — Y9389 Activity, other specified: Secondary | ICD-10-CM | POA: Insufficient documentation

## 2017-02-19 DIAGNOSIS — M549 Dorsalgia, unspecified: Secondary | ICD-10-CM | POA: Diagnosis not present

## 2017-02-19 DIAGNOSIS — Y999 Unspecified external cause status: Secondary | ICD-10-CM | POA: Insufficient documentation

## 2017-02-19 DIAGNOSIS — Z87891 Personal history of nicotine dependence: Secondary | ICD-10-CM | POA: Diagnosis not present

## 2017-02-19 MED ORDER — IBUPROFEN 600 MG PO TABS: 600.0000 mg | ORAL_TABLET | Freq: Four times a day (QID) | ORAL | 0 refills | Status: DC | PRN

## 2017-02-19 MED ORDER — METHOCARBAMOL 500 MG PO TABS: 500.0000 mg | ORAL_TABLET | Freq: Two times a day (BID) | ORAL | 0 refills | Status: DC

## 2017-02-19 MED ORDER — LIDOCAINE 5 % EX PTCH: 1.0000 | MEDICATED_PATCH | CUTANEOUS | 0 refills | Status: DC

## 2017-02-19 NOTE — ED Notes (Signed)
Patient transported to X-ray 
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
## 2017-02-19 NOTE — Discharge Instructions (Signed)
There were no acute abnormalities on the x-rays. Expect your soreness to increase over the next 2-3 days. Take it easy, but do not lay around too much as this may make any stiffness worse.  °Antiinflammatory medications: Take 600 mg of ibuprofen every 6 hours or 440 mg (over the counter dose) to 500 mg (prescription dose) of naproxen every 12 hours for the next 3 days. After this time, these medications may be used as needed for pain. Take these medications with food to avoid upset stomach. Choose only one of these medications, do not take them together.  °Tylenol: Should you continue to have additional pain while taking the ibuprofen or naproxen, you may add in tylenol as needed. Your daily total maximum amount of tylenol from all sources should be limited to 4000mg/day for persons without liver problems, or 2000mg/day for those with liver problems. °Muscle relaxer: Robaxin is a muscle relaxer and may help loosen stiff muscles. Do not take the Robaxin while driving or performing other dangerous activities.  °Lidocaine patches: These are available via either prescription or over-the-counter. The over-the-counter option may be more economical one and are likely just as effective. There are multiple over-the-counter brands, such as Salonpas. °Exercises: Be sure to perform the attached exercises starting with three times a week and working up to performing them daily. This is an essential part of preventing long term problems.  ° °Follow up with a primary care provider for any future management of these complaints. °
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
## 2017-03-02 ENCOUNTER — Ambulatory Visit (INDEPENDENT_AMBULATORY_CARE_PROVIDER_SITE_OTHER): Payer: Self-pay | Admitting: Physician Assistant

## 2017-03-02 ENCOUNTER — Ambulatory Visit (INDEPENDENT_AMBULATORY_CARE_PROVIDER_SITE_OTHER): Payer: Self-pay

## 2017-03-02 ENCOUNTER — Encounter (INDEPENDENT_AMBULATORY_CARE_PROVIDER_SITE_OTHER): Payer: Self-pay | Admitting: Physician Assistant

## 2017-03-02 DIAGNOSIS — M542 Cervicalgia: Secondary | ICD-10-CM

## 2017-03-02 DIAGNOSIS — M25512 Pain in left shoulder: Secondary | ICD-10-CM
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
## 2017-03-03 DIAGNOSIS — M25512 Pain in left shoulder: Secondary | ICD-10-CM

## 2017-03-03 MED ORDER — METHYLPREDNISOLONE ACETATE 40 MG/ML IJ SUSP
40.0000 mg | INTRAMUSCULAR | Status: AC | PRN
  Administered 2017-03-03: 40 mg via INTRA_ARTICULAR

## 2017-03-03 MED ORDER — LIDOCAINE HCL 1 % IJ SOLN
3.0000 mL | INTRAMUSCULAR | Status: AC | PRN
  Administered 2017-03-03: 3 mL

## 2017-03-16 ENCOUNTER — Encounter (INDEPENDENT_AMBULATORY_CARE_PROVIDER_SITE_OTHER): Payer: Self-pay | Admitting: Physician Assistant

## 2017-03-16 ENCOUNTER — Ambulatory Visit (INDEPENDENT_AMBULATORY_CARE_PROVIDER_SITE_OTHER): Payer: Self-pay | Admitting: Physician Assistant

## 2017-03-16 DIAGNOSIS — M542 Cervicalgia: Secondary | ICD-10-CM

## 2017-03-16 DIAGNOSIS — M25512 Pain in left shoulder: Secondary | ICD-10-CM | POA: Insufficient documentation
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
## 2017-04-13 ENCOUNTER — Encounter (INDEPENDENT_AMBULATORY_CARE_PROVIDER_SITE_OTHER): Payer: Self-pay | Admitting: Physician Assistant

## 2017-04-13 ENCOUNTER — Ambulatory Visit (INDEPENDENT_AMBULATORY_CARE_PROVIDER_SITE_OTHER): Payer: Self-pay | Admitting: Physician Assistant

## 2017-04-13 DIAGNOSIS — M25512 Pain in left shoulder: Secondary | ICD-10-CM

## 2017-04-13 DIAGNOSIS — M542 Cervicalgia: Secondary | ICD-10-CM

## 2017-04-13 MED ORDER — METHOCARBAMOL 500 MG PO TABS: 500.0000 mg | ORAL_TABLET | Freq: Two times a day (BID) | ORAL | 0 refills | Status: DC
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
## 2017-05-15 ENCOUNTER — Ambulatory Visit (INDEPENDENT_AMBULATORY_CARE_PROVIDER_SITE_OTHER): Payer: Self-pay | Admitting: Physician Assistant

## 2017-05-15 ENCOUNTER — Encounter (INDEPENDENT_AMBULATORY_CARE_PROVIDER_SITE_OTHER): Payer: Self-pay | Admitting: Physician Assistant

## 2017-05-15 DIAGNOSIS — M25512 Pain in left shoulder: Secondary | ICD-10-CM
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
## 2017-05-19 ENCOUNTER — Other Ambulatory Visit: Payer: Self-pay

## 2017-05-21 ENCOUNTER — Ambulatory Visit
Admission: RE | Admit: 2017-05-21 | Discharge: 2017-05-21 | Disposition: A | Discharge status: Home / Self Care | Payer: Self-pay | Source: Ambulatory Visit | Attending: Physician Assistant | Admitting: Physician Assistant

## 2017-05-21 DIAGNOSIS — M25512 Pain in left shoulder: Secondary | ICD-10-CM

## 2017-05-23 ENCOUNTER — Telehealth (INDEPENDENT_AMBULATORY_CARE_PROVIDER_SITE_OTHER): Payer: Self-pay | Admitting: Orthopaedic Surgery

## 2017-05-23 NOTE — Telephone Encounter (Signed)
Called patient left message on voicemail to return call to schedule appointment with Bronson CurbGil  [phone]

## 2017-05-29 ENCOUNTER — Ambulatory Visit (INDEPENDENT_AMBULATORY_CARE_PROVIDER_SITE_OTHER): Payer: Self-pay | Admitting: Physician Assistant

## 2017-05-31 ENCOUNTER — Encounter (INDEPENDENT_AMBULATORY_CARE_PROVIDER_SITE_OTHER): Payer: Self-pay | Admitting: Physician Assistant

## 2017-05-31 ENCOUNTER — Ambulatory Visit (INDEPENDENT_AMBULATORY_CARE_PROVIDER_SITE_OTHER): Payer: Self-pay | Admitting: Physician Assistant

## 2017-05-31 DIAGNOSIS — M25512 Pain in left shoulder: Secondary | ICD-10-CM

## 2017-05-31 DIAGNOSIS — G8929 Other chronic pain: Secondary | ICD-10-CM

## 2017-05-31 NOTE — Progress Notes (Signed)
Benson NorwayLamar returns today to go over the MRI of his left shoulder.  He still notes a clicking sensation in the shoulder.  States the pain is deep within the shoulder.  No radiation of pain down the arm.  Denies any feeling of shoulder dislocation.  He denies any previous dislocation of the shoulder.  He is here to review the MRI of his left shoulder.  All Left shoulder MRI dated 05/21/17 showed moderate tendinosis of the supraspinatus tendon without discrete tear moderate tendinosis of the infraspinatus tendon without discrete tear.  Biceps tendon intact.  Labrum with a posterior labral tear is and what appear labral cyst 5 x 7 mm.  Physical exam: Left shoulder he has full forward flexion.  Has a positive impingement test on the left negative on the right.  Negative sulcus test bilaterally.  Impression: Left shoulder pain  Plan: To gain more information about the labral cyst will obtain a MRI of the left shoulder with contrast..  He will follow-up after the MRI to go over results and discuss further treatment.  Questions were encouraged and answered at length today.

## 2017-06-08 ENCOUNTER — Other Ambulatory Visit (INDEPENDENT_AMBULATORY_CARE_PROVIDER_SITE_OTHER): Payer: Self-pay | Admitting: Physician Assistant

## 2017-06-08 DIAGNOSIS — M25512 Pain in left shoulder: Principal | ICD-10-CM

## 2017-06-08 DIAGNOSIS — G8929 Other chronic pain: Secondary | ICD-10-CM

## 2017-06-15 ENCOUNTER — Inpatient Hospital Stay: Admission: RE | Admit: 2017-06-15 | Payer: Self-pay | Source: Ambulatory Visit

## 2017-06-26 ENCOUNTER — Ambulatory Visit
Admission: RE | Admit: 2017-06-26 | Discharge: 2017-06-26 | Disposition: A | Discharge status: Home / Self Care | Payer: Self-pay | Source: Ambulatory Visit | Attending: Physician Assistant | Admitting: Physician Assistant

## 2017-06-26 DIAGNOSIS — G8929 Other chronic pain: Secondary | ICD-10-CM

## 2017-06-26 DIAGNOSIS — M25512 Pain in left shoulder: Principal | ICD-10-CM

## 2017-06-26 MED ORDER — IOPAMIDOL (ISOVUE-M 200) INJECTION 41%
12.0000 mL | Freq: Once | INTRAMUSCULAR | Status: AC
  Administered 2017-06-26: 12 mL via INTRA_ARTICULAR

## 2017-07-06 ENCOUNTER — Ambulatory Visit (INDEPENDENT_AMBULATORY_CARE_PROVIDER_SITE_OTHER): Payer: Self-pay | Admitting: Physician Assistant

## 2017-07-06 ENCOUNTER — Encounter (INDEPENDENT_AMBULATORY_CARE_PROVIDER_SITE_OTHER): Payer: Self-pay | Admitting: Physician Assistant

## 2017-07-06 DIAGNOSIS — M25512 Pain in left shoulder: Secondary | ICD-10-CM
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
## 2017-08-02 ENCOUNTER — Ambulatory Visit (INDEPENDENT_AMBULATORY_CARE_PROVIDER_SITE_OTHER): Payer: Self-pay | Admitting: Physician Assistant

## 2017-08-02 ENCOUNTER — Encounter (INDEPENDENT_AMBULATORY_CARE_PROVIDER_SITE_OTHER): Payer: Self-pay | Admitting: Physician Assistant

## 2017-08-02 DIAGNOSIS — M25512 Pain in left shoulder: Secondary | ICD-10-CM

## 2017-08-02 DIAGNOSIS — G8929 Other chronic pain: Secondary | ICD-10-CM

## 2017-08-02 MED ORDER — METHOCARBAMOL 500 MG PO TABS: 500.0000 mg | ORAL_TABLET | Freq: Two times a day (BID) | ORAL | 0 refills | Status: DC

## 2017-08-02 NOTE — Progress Notes (Signed)
HPI: Harry Barber returns today with his left shoulder just to discuss the MRI findings and for refill on his Robaxin.  He states that he is working with a Psychologist, educationaltrainer and his shoulder still bothers him from time to time.  He has had no sensation of the shoulder popping out of place or dislocating.  Has some pain in the posterior aspect.  He notes that he is laid off of most of his exercises but doing this daily activities like carrying groceries sometimes creates pain in the shoulder.  MRI is reviewed with him again.Posterior labral tear with adjacent 6 x 3 mm para labral cyst was seen.  Moderate tendinosis of the infraspinatus and supraspinatus tendons without a tear were seen in the type II acromion is present.  Physical exam: General well-developed well-nourished male no acute distress.  Bilateral shoulders: Has excellent strength with external and internal rotation against resistance bilaterally.  Positive impingement on the right negative on the left.  Empty can test is negative bilaterally.  Impression: Left shoulder pain/impingement  Plan: At this point time recommend a he either try formal physical therapy, repeat subacromial injection or shoulder arthroscopy with extensive debridement, debridement of labrum and SA D.  He defers all 3 and would like to disc give this time.  Did talk to him about taking an anti-inflammatory Aleve 2 tablets in the morning 2 tablets in the afternoon daily with food.  Refill on his Robaxin was given.  He will follow-up on a as needed basis pain persist or becomes worse.

## 2017-10-23 ENCOUNTER — Encounter (HOSPITAL_COMMUNITY): Payer: Self-pay | Admitting: Emergency Medicine

## 2017-10-23 ENCOUNTER — Other Ambulatory Visit: Payer: Self-pay

## 2017-10-23 ENCOUNTER — Ambulatory Visit (HOSPITAL_COMMUNITY)
Admission: EM | Admit: 2017-10-23 | Discharge: 2017-10-23 | Disposition: A | Discharge status: Home / Self Care | Payer: Self-pay | Attending: Family Medicine | Admitting: Family Medicine

## 2017-10-23 DIAGNOSIS — R369 Urethral discharge, unspecified: Secondary | ICD-10-CM | POA: Insufficient documentation

## 2017-10-23 DIAGNOSIS — R3915 Urgency of urination: Secondary | ICD-10-CM | POA: Insufficient documentation

## 2017-10-23 DIAGNOSIS — R350 Frequency of micturition: Secondary | ICD-10-CM | POA: Insufficient documentation

## 2017-10-23 DIAGNOSIS — Z87891 Personal history of nicotine dependence: Secondary | ICD-10-CM | POA: Insufficient documentation

## 2017-10-23 DIAGNOSIS — R300 Dysuria: Secondary | ICD-10-CM | POA: Insufficient documentation

## 2017-10-23 LAB — POCT URINALYSIS DIP (DEVICE)
BILIRUBIN URINE: NEGATIVE
Glucose, UA: NEGATIVE mg/dL
Ketones, ur: NEGATIVE mg/dL
LEUKOCYTES UA: NEGATIVE
NITRITE: NEGATIVE
Protein, ur: NEGATIVE mg/dL
Specific Gravity, Urine: 1.01 (ref 1.005–1.030)
Urobilinogen, UA: 0.2 mg/dL (ref 0.0–1.0)
pH: 6.5 (ref 5.0–8.0)

## 2017-10-23 MED ORDER — AZITHROMYCIN 250 MG PO TABS
ORAL_TABLET | ORAL | Status: AC
  Filled 2017-10-23: qty 4

## 2017-10-23 MED ORDER — AZITHROMYCIN 250 MG PO TABS
ORAL_TABLET | ORAL | Status: AC
  Filled 2017-10-23: qty 1

## 2017-10-23 MED ORDER — CEFTRIAXONE SODIUM 250 MG IJ SOLR
250.0000 mg | Freq: Once | INTRAMUSCULAR | Status: AC
  Administered 2017-10-23: 250 mg via INTRAMUSCULAR

## 2017-10-23 MED ORDER — CEFTRIAXONE SODIUM 250 MG IJ SOLR
INTRAMUSCULAR | Status: AC
  Filled 2017-10-23: qty 250

## 2017-10-23 MED ORDER — IBUPROFEN 800 MG PO TABS
ORAL_TABLET | ORAL | Status: AC
Start: 2017-10-23 — End: 2017-10-23
  Filled 2017-10-23: qty 1

## 2017-10-23 MED ORDER — AZITHROMYCIN 250 MG PO TABS
1000.0000 mg | ORAL_TABLET | Freq: Once | ORAL | Status: AC
  Administered 2017-10-23: 1000 mg via ORAL

## 2017-10-23 NOTE — Discharge Instructions (Addendum)
Urine did not show signs of infection today Given rocephin 250mg  injection and azithromycin 1g in office Urine cytology sent.  We will call you with the results.   Push fluids and get plenty of rest.   We will follow up with you regarding the results of your test If tests are positive, please abstain from sexual activity for at least 7 days and notify partners Follow up with PCP if symptoms persists Return here or go to ER if you have any new or worsening symptoms such as fever, worsening abdominal pain, nausea/vomiting, flank pain, etc...Marland Kitchen

## 2017-10-23 NOTE — ED Triage Notes (Signed)
Patient noticed painful urination.  Noticed urgency last night

## 2017-10-23 NOTE — ED Provider Notes (Signed)
[facility]   SUBJECTIVE:  Harry Barber is a 32 y.o. male who complains of urinary urgency and dysuria that began last night.  Denies a precipitating event, but states he has been fasting and changing his diet recently.  Last sexual activity was approximately one month ago with 1 male partner, protection was used but the condom broke.  Denies lower abdominal and flank pain   Has tried cranberry juice and pushing fluids with relief.  Symptoms are made worse with urination.  Denies similar symptoms in the past.  Complains of white penile discharge.  Denies fever, chills, nausea, vomiting, abdominal pain, flank pain, penile bleeding, testicular pain, testicular swelling or hematuria.    LMP: No LMP for male patient.  ROS: As in HPI.  Past Medical History:  Diagnosis Date  . PPD positive, treated    7/208 upon hire at [facility]; received 9 months isoniazid+vit B6 therapy  . Scabies    History reviewed. No pertinent surgical history. No Known Allergies No current facility-administered medications on file prior to encounter.    Current Outpatient Medications on File Prior to Encounter  Medication Sig Dispense Refill  . doxycycline (VIBRAMYCIN) 100 MG capsule Take 1 capsule (100 mg total) by mouth 2 (two) times daily. One po bid x 7 days. ONLY START TAKING IF SYMPTOMS WORSEN (Patient not taking: Reported on 06/28/2016) 14 capsule 0  . HYDROcodone-acetaminophen (NORCO/VICODIN) 5-325 MG tablet Take 1-2 tablets by mouth every 4 (four) hours as needed. 8 tablet 0  . ibuprofen (ADVIL,MOTRIN) 600 MG tablet Take 1 tablet (600 mg total) by mouth every 6 (six) hours as needed. 30 tablet 0  . lidocaine (LIDODERM) 5 % Place 1 patch onto the skin daily. Remove & Discard patch within 12 hours or as directed by MD 30 patch 0  . meloxicam (MOBIC) 7.5 MG tablet Take 1 tablet (7.5 mg total) by mouth daily. Take one tablet daily for 2 weeks (Patient not taking: Reported on 06/28/2016) 14 tablet 0  .  methocarbamol (ROBAXIN) 500 MG tablet Take 1 tablet (500 mg total) by mouth 2 (two) times daily. 40 tablet 0  . permethrin (ELIMITE) 5 % cream Apply to affected area once (Patient not taking: Reported on 06/28/2016) 60 g 0  . valACYclovir (VALTREX) 1000 MG tablet Take 1 tablet (1,000 mg total) by mouth 2 (two) times daily. (Patient not taking: Reported on 06/28/2016) 20 tablet 0   Social History   Socioeconomic History  . Marital status: Single    Spouse name: Not on file  . Number of children: Not on file  . Years of education: Not on file  . Highest education level: Not on file  Occupational History  . Not on file  Social Needs  . Financial resource strain: Not on file  . Food insecurity:    Worry: Not on file    Inability: Not on file  . Transportation needs:    Medical: Not on file    Non-medical: Not on file  Tobacco Use  . Smoking status: Former Games developer  . Smokeless tobacco: Never Used  Substance and Sexual Activity  . Alcohol use: Yes    Comment: occasional  . Drug use: No  . Sexual activity: Not on file  Lifestyle  . Physical activity:    Days per week: Not on file    Minutes per session: Not on file  . Stress: Not on file  Relationships  . Social connections:    Talks on phone:  Not on file    Gets together: Not on file    Attends religious service: Not on file    Active member of club or organization: Not on file    Attends meetings of clubs or organizations: Not on file    Relationship status: Not on file  . Intimate partner violence:    Fear of current or ex partner: Not on file    Emotionally abused: Not on file    Physically abused: Not on file    Forced sexual activity: Not on file  Other Topics Concern  . Not on file  Social History Narrative   [place]   Smokes 3 cig/day-quit for 5 months. Recently restarted during holidays 2/2 passive exposure/hanging out with friends.   Has smoked a total of 3-4 years. Drinks 2 beers every  other weekend. Sexually active but always uses condoms.   No family history on file.  OBJECTIVE:  Vitals:   10/23/17 1042  BP: (!) 143/63  Pulse: (!) 58  Resp: 18  Temp: 98 F (36.7 C)  TempSrc: Oral  SpO2: 100%   General appearance: AOx3 in no acute distress HEENT: NCAT.  Oropharynx clear.  Lungs: clear to auscultation bilaterally without adventitious breath sounds Heart: regular rate and rhythm.  Radial pulses 2+ symmetrical bilaterally Abdomen: soft; non-distended; normal active bowel sounds; nontender to palpation; bowel sounds present; no guarding; declines rectal exam GU: Declines Back: no CVA tenderness Extremities: no edema; symmetrical with no gross deformities Skin: warm and dry Neurologic: Ambulates from chair to exam table without difficulty Psychological: alert and cooperative; normal mood and affect  Labs Reviewed  POCT URINALYSIS DIP (DEVICE) - Abnormal; Notable for the following components:      Result Value   Hgb urine dipstick TRACE (*)    All other components within normal limits  URINE CYTOLOGY ANCILLARY ONLY    ASSESSMENT & PLAN:  1. Dysuria   2. Urinary frequency   3. Penile discharge     Meds ordered this encounter  Medications  . azithromycin (ZITHROMAX) tablet 1,000 mg  . cefTRIAXone (ROCEPHIN) injection 250 mg   Given rocephin 250mg  injection and azithromycin 1g in office Urine cytology sent.  We will call you with the results.   Push fluids and get plenty of rest.   We will follow up with you regarding the results of your test If tests are positive, please abstain from sexual activity for at least 7 days and notify partners Follow up with PCP if symptoms persists Return here or go to ER if you have any new or worsening symptoms such as fever, worsening abdominal pain, nausea/vomiting, flank pain, etc...  Outlined signs and symptoms indicating need for more acute intervention. Patient verbalized understanding. After Visit Summary  given.     Rennis HardingWurst, Anntoinette Haefele, PA-C 10/23/17 1121

## 2017-10-23 NOTE — ED Notes (Signed)
Sent for a dirty and clean urine.   

## 2017-10-24 LAB — URINE CYTOLOGY ANCILLARY ONLY
CHLAMYDIA, DNA PROBE: POSITIVE — AB
NEISSERIA GONORRHEA: NEGATIVE

## 2017-10-27 ENCOUNTER — Telehealth (HOSPITAL_COMMUNITY): Payer: Self-pay

## 2017-10-27 NOTE — Telephone Encounter (Signed)
Chlamydia is positive.  This was treated at the urgent care visit with po zithromax 1g.  Pt contacted and made aware of results. Educated pt to please refrain from sexual intercourse for 7 days to give the medicine time to work.  Sexual partners need to be notified and tested/treated.  Condoms may reduce risk of reinfection.  Recheck or followup with PCP for further evaluation if symptoms are not improving.  [facility] notified 

## 2019-03-28 ENCOUNTER — Emergency Department (HOSPITAL_COMMUNITY)
Admission: EM | Admit: 2019-03-28 | Discharge: 2019-03-28 | Disposition: A | Discharge status: Home / Self Care | Payer: No Typology Code available for payment source | Attending: Emergency Medicine | Admitting: Emergency Medicine

## 2019-03-28 ENCOUNTER — Other Ambulatory Visit: Payer: Self-pay

## 2019-03-28 ENCOUNTER — Encounter (HOSPITAL_COMMUNITY): Payer: Self-pay | Admitting: Emergency Medicine

## 2019-03-28 ENCOUNTER — Emergency Department (HOSPITAL_COMMUNITY): Payer: No Typology Code available for payment source

## 2019-03-28 DIAGNOSIS — S161XXA Strain of muscle, fascia and tendon at neck level, initial encounter: Secondary | ICD-10-CM | POA: Insufficient documentation

## 2019-03-28 DIAGNOSIS — R030 Elevated blood-pressure reading, without diagnosis of hypertension: Secondary | ICD-10-CM | POA: Diagnosis not present

## 2019-03-28 DIAGNOSIS — Y999 Unspecified external cause status: Secondary | ICD-10-CM | POA: Diagnosis not present

## 2019-03-28 DIAGNOSIS — Y929 Unspecified place or not applicable: Secondary | ICD-10-CM | POA: Diagnosis not present

## 2019-03-28 DIAGNOSIS — M542 Cervicalgia: Secondary | ICD-10-CM | POA: Diagnosis present

## 2019-03-28 DIAGNOSIS — F17210 Nicotine dependence, cigarettes, uncomplicated: Secondary | ICD-10-CM | POA: Diagnosis not present

## 2019-03-28 DIAGNOSIS — Y939 Activity, unspecified: Secondary | ICD-10-CM | POA: Insufficient documentation

## 2019-03-28 MED ORDER — TIZANIDINE HCL 4 MG PO TABS: 4.0000 mg | ORAL_TABLET | Freq: Four times a day (QID) | ORAL | 0 refills | Status: DC | PRN

## 2019-03-28 MED ORDER — MELOXICAM 7.5 MG PO TABS: 7.5000 mg | ORAL_TABLET | Freq: Every day | ORAL | 0 refills | Status: AC
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
## 2019-03-28 NOTE — Discharge Instructions (Addendum)
You were given a prescription is a muscle relaxer.  You should not drive, work, consume alcohol, or operate machinery while taking this medication as it can make you very drowsy.    Please take Mobic as prescribed as well.  Your examination today is most concerning for a muscular injury  1. Medications: Take Mobic as prescribed you may also use tylenol for pain control, take all usual home medications as they are prescribed 2. Treatment: rest, ice, elevate and use an ACE wrap or other compressive therapy to decrease swelling. Also drink plenty of fluids and do plenty of gentle stretching and move the affected muscle through its normal range of motion to prevent stiffness. 3. Follow Up: If your symptoms do not improve please follow up with orthopedics/sports medicine or your PCP for discussion of your diagnoses and further evaluation after today's visit; if you do not have a primary care doctor use the resource guide provided to find one; Please return to the ER for worsening symptoms or other concerns.   Follow-up with [place] [facility] for your elevated blood pressure.  As we discussed today this can cause long-term damage if not addressed.
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
## 2019-12-18 IMAGING — MR MR SHOULDER*L* W/ CM
6 series · 38 of 40 positions shown · IV contrast (agent unspecified)
Comparison: 05/21/2017

EXAM:
MR ARTHROGRAM OF THE LEFT SHOULDER
CLINICAL DATA: Chronic left shoulder pain.  Ongoing for 4 months.

MR ARTHROGRAM OF THE RIGHT SHOULDER
TECHNIQUE: Multiplanar, multisequence MR imaging of the right shoulder was
performed following the administration of intra-articular contrast.
CONTRAST:  See Injection Documentation.

[Series 3: T1 fat-sat · axial · 4.0mm · 0.27mm/px · z∈[-53,+45]mm · 8 of 22 slices shown (1 of 4)]
[im 1/22]
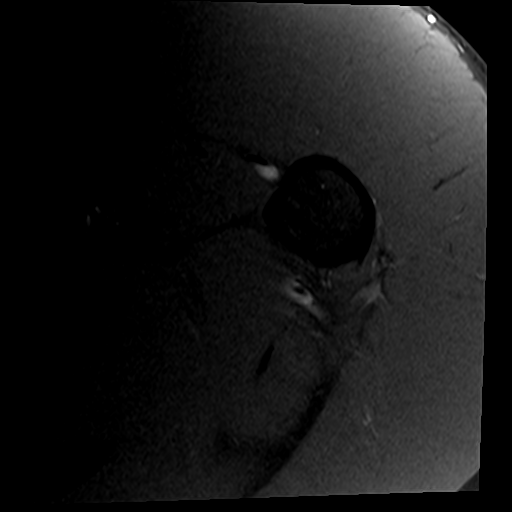
[im 4/22]
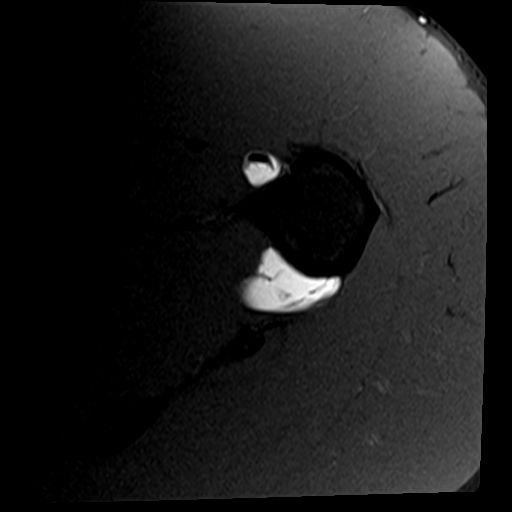
[im 7/22]
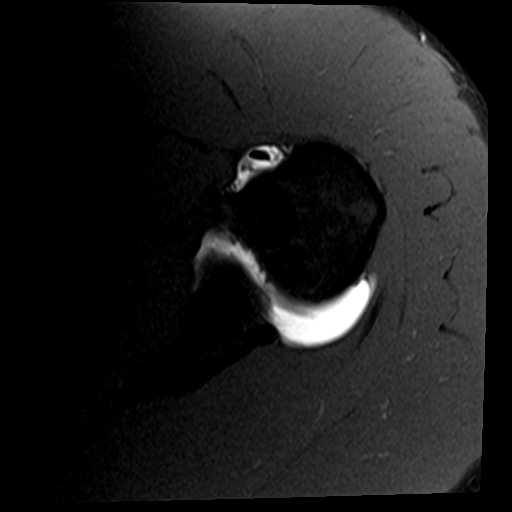
[im 10/22]
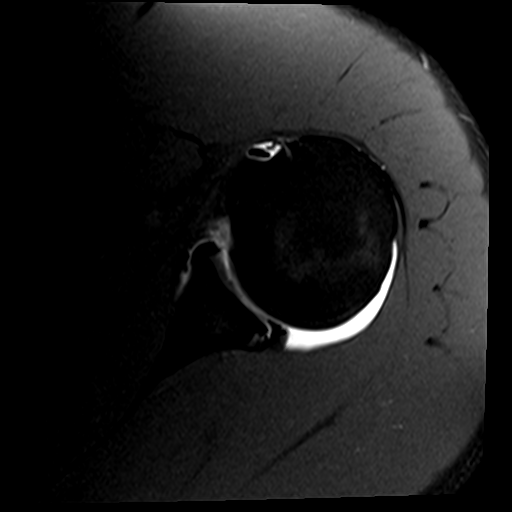
[im 13/22]
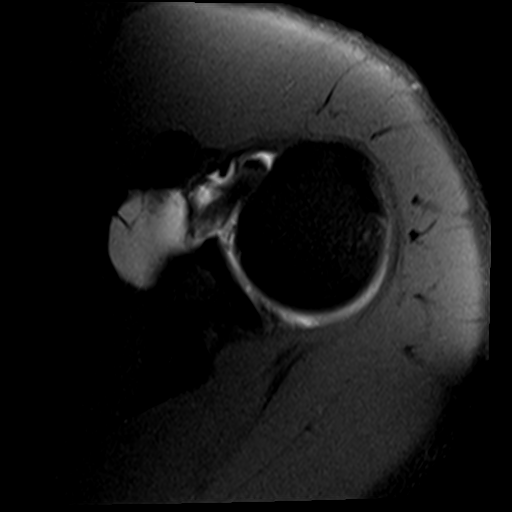
[im 16/22]
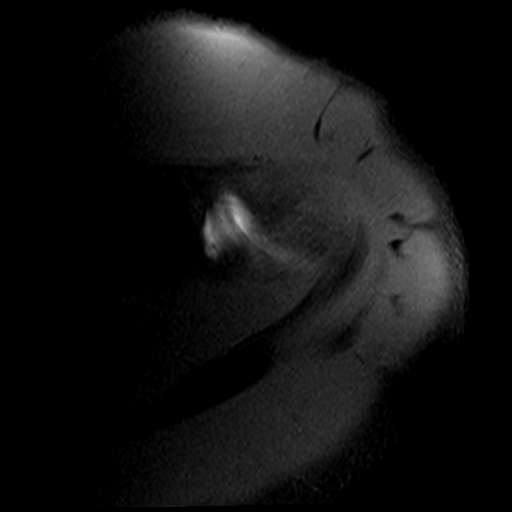
[im 19/22]
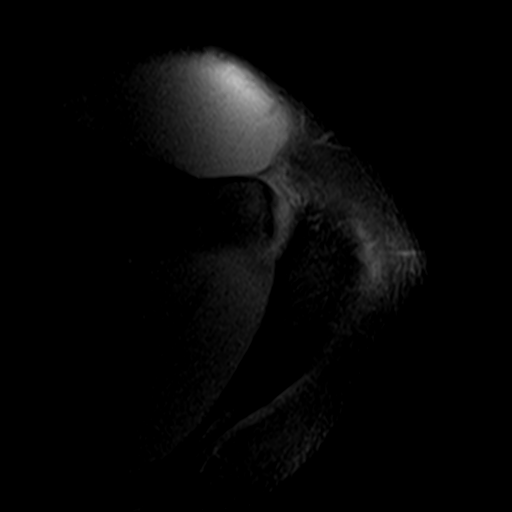
[im 22/22]
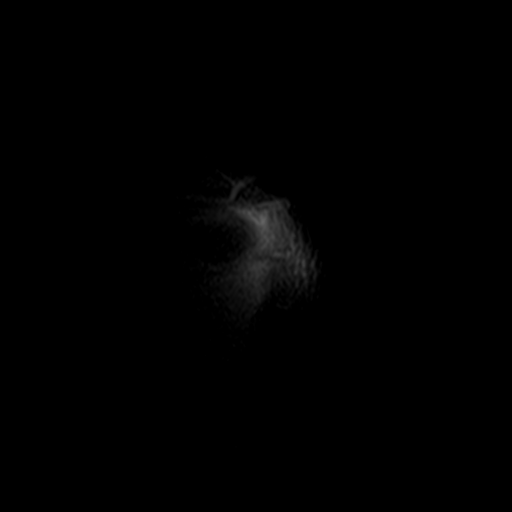

[Series 4: T2 fat-sat · oblique · 4.0mm · 0.55mm/px · 7 of 20 slices shown (1 of 2)]
[im 1/20]
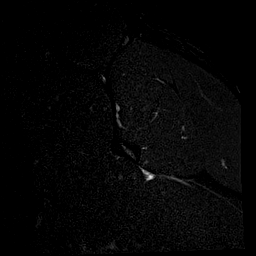
[im 4/20]
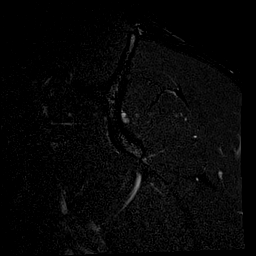
[im 7/20]
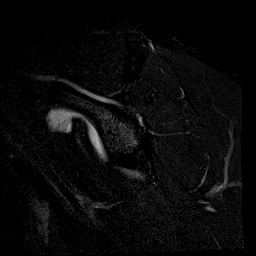
[im 10/20]
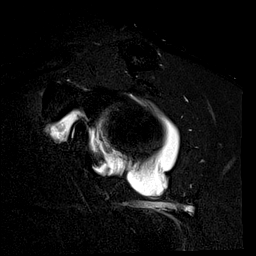
[im 13/20]
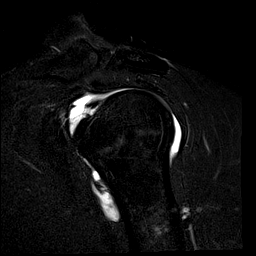
[im 16/20]
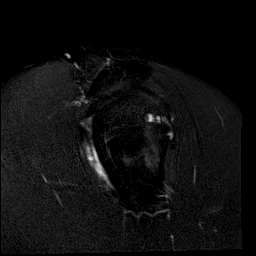
[im 20/20]
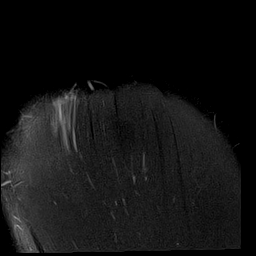

[Series 5: T1 fat-sat · oblique · 4.0mm · 0.47mm/px · 6 of 19 slices shown (2 of 4)]
[im 1/19]
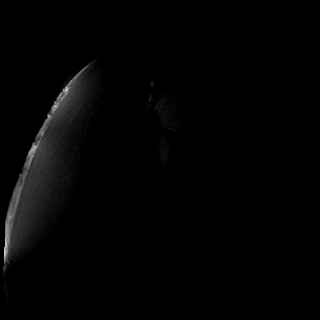
[im 4/19]
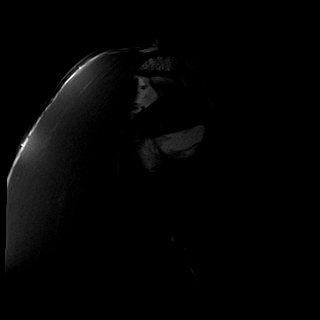
[im 8/19]
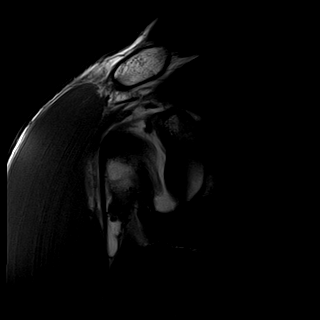
[im 11/19]
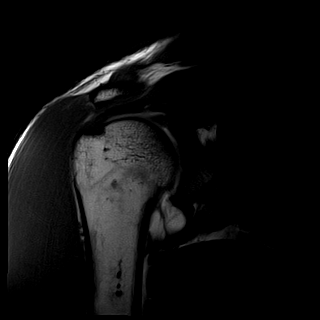
[im 15/19]
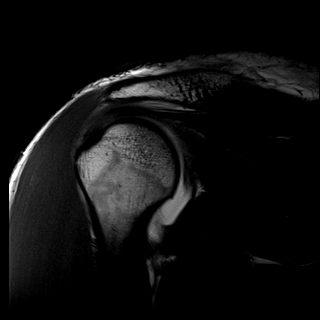
[im 19/19]
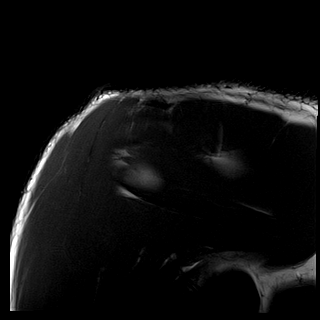

[Series 6: T1 fat-sat · oblique · 4.0mm · 0.55mm/px · 6 of 19 slices shown (3 of 4)]
[im 1/19]
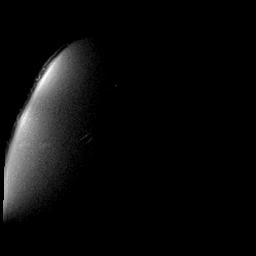
[im 4/19]
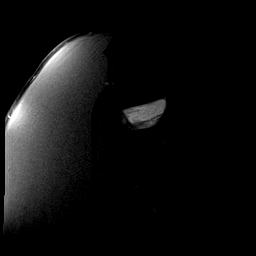
[im 8/19]
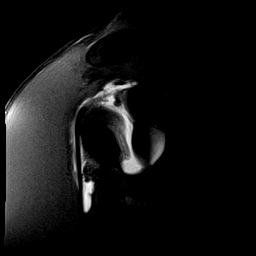
[im 11/19]
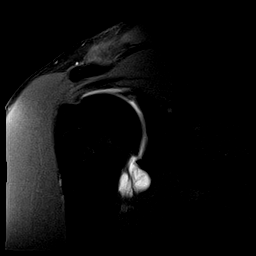
[im 15/19]
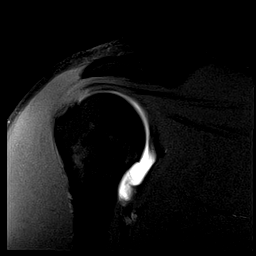
[im 19/19]
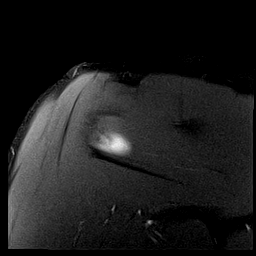

[Series 7: T2 fat-sat · oblique · 4.0mm · 0.55mm/px · 6 of 19 slices shown (2 of 2)]
[im 1/19]
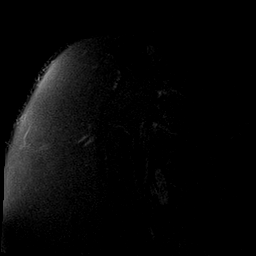
[im 4/19]
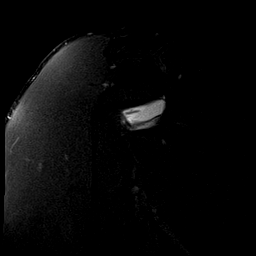
[im 8/19]
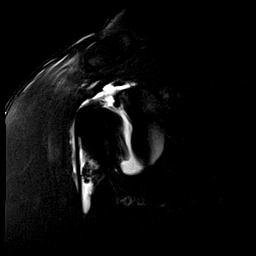
[im 11/19]
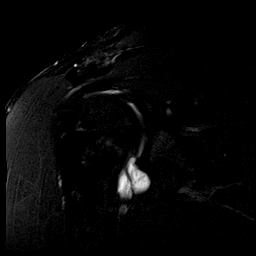
[im 15/19]
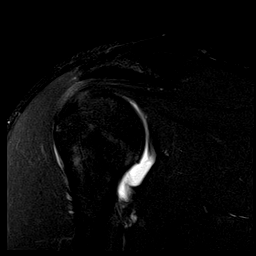
[im 19/19]
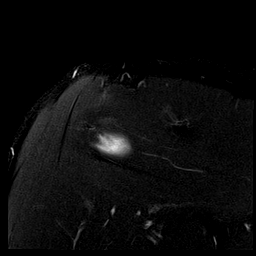

[Series 12: T1 fat-sat · sagittal · 4.0mm · 0.36mm/px · 5 of 20 slices shown (4 of 4)]
[im 1/20]
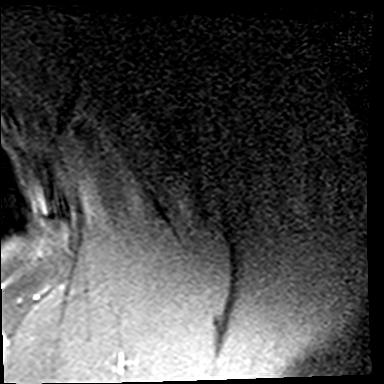
[im 4/20]
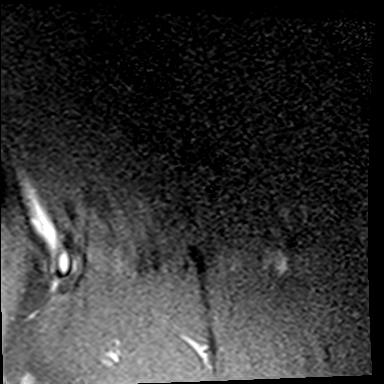
[im 7/20]
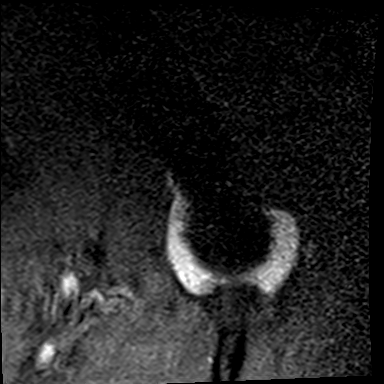
[im 10/20]
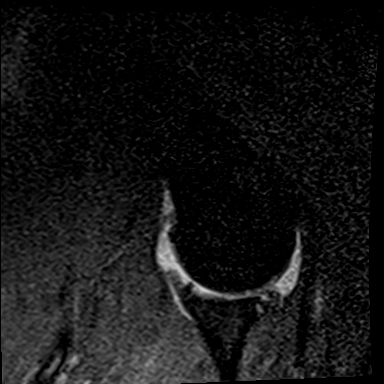
[im 13/20]
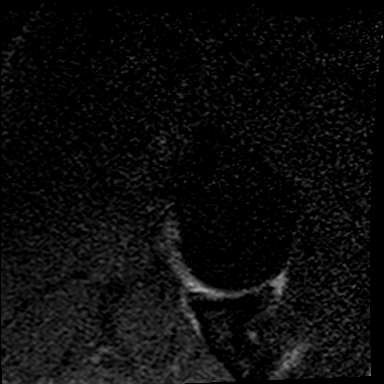

[38 of 40 positions shown; findings below may reference images not displayed]

FINDINGS: Rotator cuff: Moderate tendinosis of the supraspinatus and
infraspinatus tendons without a discrete tear. Teres minor tendon is
intact. Subscapularis tendon is intact.

Muscles: No atrophy or fatty replacement of nor abnormal signal
within, the muscles of the rotator cuff.

Biceps long head: Intact.

Acromioclavicular Joint: Normal acromioclavicular joint. Type II
acromion. No subacromial/subdeltoid bursal fluid or contrast.

Glenohumeral Joint: Intraarticular contrast distending the joint
capsule. No chondral defect. Normal glenohumeral ligaments.

Labrum: Persistent posterior labral tear with an adjacent 6 x 3 mm
paralabral cyst.

Bones: No acute osseous abnormality.  No aggressive osseous lesion.
IMPRESSION: 1. Persistent posterior labral tear with an adjacent 6 x 3 mm
paralabral cyst.
2. Moderate tendinosis of the supraspinatus and infraspinatus
tendons without a discrete tear.

## 2021-02-22 ENCOUNTER — Other Ambulatory Visit: Payer: Self-pay

## 2021-02-22 ENCOUNTER — Encounter (HOSPITAL_COMMUNITY): Payer: Self-pay | Admitting: *Deleted

## 2021-02-22 ENCOUNTER — Ambulatory Visit (HOSPITAL_COMMUNITY)
Admission: EM | Admit: 2021-02-22 | Discharge: 2021-02-22 | Disposition: A | Discharge status: Home / Self Care | Payer: BC Managed Care – PPO | Attending: Internal Medicine | Admitting: Internal Medicine

## 2021-02-22 DIAGNOSIS — R300 Dysuria: Secondary | ICD-10-CM

## 2021-02-22 LAB — POCT URINALYSIS DIPSTICK, ED / UC
Bilirubin Urine: NEGATIVE
Glucose, UA: NEGATIVE mg/dL
Ketones, ur: NEGATIVE mg/dL
Leukocytes,Ua: NEGATIVE
Nitrite: NEGATIVE
Protein, ur: NEGATIVE mg/dL
Specific Gravity, Urine: 1.015 (ref 1.005–1.030)
Urobilinogen, UA: 0.2 mg/dL (ref 0.0–1.0)
pH: 5.5 (ref 5.0–8.0)

## 2021-02-22 MED ORDER — PHENAZOPYRIDINE HCL 200 MG PO TABS: 200.0000 mg | ORAL_TABLET | Freq: Three times a day (TID) | ORAL | 0 refills | Status: DC

## 2021-02-22 NOTE — ED Provider Notes (Signed)
[facility]    CSN: [id number] Arrival date & time: 02/22/21  0840      History   Chief Complaint Chief Complaint  Patient presents with   Dysuria    HPI Harry Barber is a 35 y.o. male comes to urgent care with 4-day history of burning on urination.  Symptoms started insidiously and has been persistent.  Patient denies any urgency or frequency.  No lower abdominal pain.  No nausea, vomiting or flank pain.  No fever or chills.  Patient is sexually active.  He denies any groin pain, testicular pain or urethral discharge.   HPI  Past Medical History:  Diagnosis Date   PPD positive, treated    7/208 upon hire at [facility]; received 9 months isoniazid+vit B6 therapy   Scabies     Patient Active Problem List   Diagnosis Date Noted   Acute pain of left shoulder 03/16/2017   Cervicalgia 03/16/2017   BROKEN TOOTH 02/17/2009   TOBACCO USER 07/21/2008   ECZEMA 07/21/2008   DYSURIA 07/21/2008    History reviewed. No pertinent surgical history.     Home Medications    Prior to Admission medications   Medication Sig Start Date End Date Taking? Authorizing Provider  phenazopyridine (PYRIDIUM) 200 MG tablet Take 1 tablet (200 mg total) by mouth 3 (three) times daily. 02/22/21  Yes Delon Revelo, Britta Mccreedy, MD  HYDROcodone-acetaminophen (NORCO/VICODIN) 5-325 MG tablet Take 1-2 tablets by mouth every 4 (four) hours as needed. 06/28/16   Elpidio Anis, PA-C  ibuprofen (ADVIL,MOTRIN) 600 MG tablet Take 1 tablet (600 mg total) by mouth every 6 (six) hours as needed. 02/19/17   Joy, Shawn C, PA-C  lidocaine (LIDODERM) 5 % Place 1 patch onto the skin daily. Remove & Discard patch within 12 hours or as directed by MD 02/19/17   Joy, Shawn C, PA-C  methocarbamol (ROBAXIN) 500 MG tablet Take 1 tablet (500 mg total) by mouth 2 (two) times daily. 08/02/17   Kirtland Bouchard, PA-C  tiZANidine (ZANAFLEX) 4 MG tablet Take 1 tablet (4 mg total) by mouth every 6 (six) hours as needed for  muscle spasms. 03/28/19   Gailen Shelter, PA    Family History History reviewed. No pertinent family history.  Social History Social History   Tobacco Use   Smoking status: Every Day   Smokeless tobacco: Never   Tobacco comments:    black n mild  Substance Use Topics   Alcohol use: Yes    Comment: occasional   Drug use: No     Allergies   Chocolate   Review of Systems Review of Systems  Gastrointestinal: Negative.  Negative for abdominal pain.  Genitourinary:  Positive for dysuria. Negative for flank pain, frequency, penile discharge, penile pain and urgency.  Musculoskeletal: Negative.     Physical Exam Triage Vital Signs ED Triage Vitals  Enc Vitals Group     BP 02/22/21 0938 (!) 143/74     Pulse Rate 02/22/21 0938 64     Resp 02/22/21 0938 18     Temp 02/22/21 0938 99 F (37.2 C)     Temp src --      SpO2 02/22/21 0938 100 %     Weight --      Height --      Head Circumference --      Peak Flow --      Pain Score 02/22/21 0934 0     Pain Loc --  Pain Edu? --      Excl. in GC? --    No data found.  Updated Vital Signs BP (!) 143/74   Pulse 64   Temp 99 F (37.2 C)   Resp 18   SpO2 100%   Visual Acuity Right Eye Distance:   Left Eye Distance:   Bilateral Distance:    Right Eye Near:   Left Eye Near:    Bilateral Near:     Physical Exam Vitals and nursing note reviewed.  Constitutional:      General: He is not in acute distress.    Appearance: He is not ill-appearing.  Cardiovascular:     Rate and Rhythm: Normal rate and regular rhythm.  Pulmonary:     Effort: Pulmonary effort is normal.     Breath sounds: Normal breath sounds.  Abdominal:     General: Bowel sounds are normal. There is no distension.     Palpations: Abdomen is soft.     Tenderness: There is no abdominal tenderness. There is no guarding.  Musculoskeletal:        General: Normal range of motion.  Neurological:     Mental Status: He is alert.     UC  Treatments / Results  Labs (all labs ordered are listed, but only abnormal results are displayed) Labs Reviewed  POCT URINALYSIS DIPSTICK, ED / UC - Abnormal; Notable for the following components:      Result Value   Hgb urine dipstick MODERATE (*)    All other components within normal limits  CYTOLOGY, (ORAL, ANAL, URETHRAL) ANCILLARY ONLY    EKG   Radiology No results found.  Procedures Procedures (including critical care time)  Medications Ordered in UC Medications - No data to display  Initial Impression / Assessment and Plan / UC Course  I have reviewed the triage vital signs and the nursing notes.  Pertinent labs & imaging results that were available during my care of the patient were reviewed by me and considered in my medical decision making (see chart for details).     1.  Microscopic hematuria with dysuria: Point-of-care urinalysis is remarkable for hemoglobin Urine cultures have been sent We will call patient with recommendations if labs are abnormal Pyridium as needed for pain Patient is advised to increase oral fluid intake Hematuria may be related to a passed stone but the clinical symptoms does not indicate renal colic. Return to urgent care if they have any further concerns. Final Clinical Impressions(s) / UC Diagnoses   Final diagnoses:  Dysuria     Discharge Instructions      Increase oral fluid intake Take medications as prescribed You do not have urinary tract infection We will call you with recommendations if labs are abnormal Return to urgent care if you have any further concerns.   ED Prescriptions     Medication Sig Dispense Auth. Provider   phenazopyridine (PYRIDIUM) 200 MG tablet Take 1 tablet (200 mg total) by mouth 3 (three) times daily. 15 tablet Nisha Dhami, Britta Mccreedy, MD      PDMP not reviewed this encounter.   Merrilee Jansky, MD 02/22/21 1128

## 2021-02-22 NOTE — ED Triage Notes (Signed)
Dysuria started last Thursday.

## 2021-02-22 NOTE — Discharge Instructions (Addendum)
Increase oral fluid intake Take medications as prescribed You do not have urinary tract infection We will call you with recommendations if labs are abnormal Return to urgent care if you have any further concerns.

## 2021-02-23 LAB — CYTOLOGY, (ORAL, ANAL, URETHRAL) ANCILLARY ONLY
Chlamydia: NEGATIVE
Comment: NEGATIVE
Comment: NEGATIVE
Comment: NORMAL
Neisseria Gonorrhea: NEGATIVE
Trichomonas: NEGATIVE

## 2023-06-28 ENCOUNTER — Encounter (HOSPITAL_COMMUNITY): Payer: Self-pay

## 2023-06-28 ENCOUNTER — Ambulatory Visit (HOSPITAL_COMMUNITY)
Admission: EM | Admit: 2023-06-28 | Discharge: 2023-06-28 | Disposition: A | Discharge status: Home / Self Care | Payer: Self-pay | Attending: Physician Assistant | Admitting: Physician Assistant

## 2023-06-28 DIAGNOSIS — J111 Influenza due to unidentified influenza virus with other respiratory manifestations: Secondary | ICD-10-CM

## 2023-06-28 LAB — POCT INFLUENZA A/B
Influenza A, POC: NEGATIVE
Influenza B, POC: NEGATIVE

## 2023-06-28 MED ORDER — OSELTAMIVIR PHOSPHATE 75 MG PO CAPS: 75.0000 mg | ORAL_CAPSULE | Freq: Two times a day (BID) | ORAL | 0 refills | Status: AC

## 2023-06-28 NOTE — ED Triage Notes (Signed)
Pt c/o body aches/muscle soreness and cold sweats x2 days.  Pt's daughter tested positive for Flu.

## 2023-06-28 NOTE — ED Provider Notes (Addendum)
[facility]    CSN: [id number] Arrival date & time: 06/28/23  1345      History   Chief Complaint Chief Complaint  Patient presents with   Generalized Body Aches    HPI Harry Barber is a 38 y.o. male.   HPI  Patient states he has had body aches, fatigue, night sweats He reports having a mild cough on Monday that has resolved as of today He reports his daughter tested positive for the flu yesterday  Interventions: nothing    Past Medical History:  Diagnosis Date   PPD positive, treated    7/208 upon hire at [facility]; received 9 months isoniazid+vit B6 therapy   Scabies     Patient Active Problem List   Diagnosis Date Noted   Acute pain of left shoulder 03/16/2017   Cervicalgia 03/16/2017   BROKEN TOOTH 02/17/2009   TOBACCO USER 07/21/2008   ECZEMA 07/21/2008   DYSURIA 07/21/2008    History reviewed. No pertinent surgical history.     Home Medications    Prior to Admission medications   Medication Sig Start Date End Date Taking? Authorizing Provider  oseltamivir (TAMIFLU) 75 MG capsule Take 1 capsule (75 mg total) by mouth 2 (two) times daily for 5 days. 06/28/23 07/03/23 Yes Jacere Pangborn E, PA-C  HYDROcodone-acetaminophen (NORCO/VICODIN) 5-325 MG tablet Take 1-2 tablets by mouth every 4 (four) hours as needed. 06/28/16   Elpidio Anis, PA-C  ibuprofen (ADVIL,MOTRIN) 600 MG tablet Take 1 tablet (600 mg total) by mouth every 6 (six) hours as needed. 02/19/17   Joy, Shawn C, PA-C  lidocaine (LIDODERM) 5 % Place 1 patch onto the skin daily. Remove & Discard patch within 12 hours or as directed by MD 02/19/17   Joy, Shawn C, PA-C  methocarbamol (ROBAXIN) 500 MG tablet Take 1 tablet (500 mg total) by mouth 2 (two) times daily. 08/02/17   Kirtland Bouchard, PA-C  phenazopyridine (PYRIDIUM) 200 MG tablet Take 1 tablet (200 mg total) by mouth 3 (three) times daily. 02/22/21   Lamptey, Britta Mccreedy, MD  tiZANidine (ZANAFLEX) 4 MG tablet Take 1 tablet (4 mg total)  by mouth every 6 (six) hours as needed for muscle spasms. 03/28/19   Gailen Shelter, PA    Family History No family history on file.  Social History Social History   Tobacco Use   Smoking status: Former    Types: Cigarettes   Smokeless tobacco: Never   Tobacco comments:    black n mild  Vaping Use   Vaping status: Never Used  Substance Use Topics   Alcohol use: Yes    Comment: occasional   Drug use: No     Allergies   Chocolate   Review of Systems Review of Systems  Constitutional:  Positive for chills, diaphoresis, fatigue and fever.  HENT:  Positive for congestion, postnasal drip and rhinorrhea. Negative for ear pain, sinus pressure, sinus pain and sore throat.   Respiratory:  Positive for cough. Negative for shortness of breath and wheezing.   Gastrointestinal:  Negative for diarrhea, nausea and vomiting.  Musculoskeletal:  Positive for myalgias.  Neurological:  Negative for dizziness, light-headedness and headaches.     Physical Exam Triage Vital Signs ED Triage Vitals  Encounter Vitals Group     BP 06/28/23 1436 (!) 157/81     Systolic BP Percentile --      Diastolic BP Percentile --      Pulse Rate 06/28/23 1436 (!) 57  Resp 06/28/23 1436 18     Temp 06/28/23 1436 98.7 F (37.1 C)     Temp Source 06/28/23 1436 Oral     SpO2 06/28/23 1436 97 %     Weight --      Height --      Head Circumference --      Peak Flow --      Pain Score 06/28/23 1435 6     Pain Loc --      Pain Education --      Exclude from Growth Chart --    No data found.  Updated Vital Signs BP (!) 157/81 (BP Location: Left Arm)   Pulse (!) 57   Temp 98.7 F (37.1 C) (Oral)   Resp 18   SpO2 97%   Visual Acuity Right Eye Distance:   Left Eye Distance:   Bilateral Distance:    Right Eye Near:   Left Eye Near:    Bilateral Near:     Physical Exam Vitals reviewed.  Constitutional:      General: He is awake.     Appearance: Normal appearance. He is well-developed  and well-groomed.  HENT:     Head: Normocephalic and atraumatic.     Right Ear: Hearing, tympanic membrane and ear canal normal.     Left Ear: Hearing, tympanic membrane and ear canal normal.     Mouth/Throat:     Lips: Pink.     Mouth: Mucous membranes are moist.     Pharynx: Oropharynx is clear. Uvula midline. No pharyngeal swelling, oropharyngeal exudate, posterior oropharyngeal erythema, uvula swelling or postnasal drip.     Tonsils: No tonsillar exudate or tonsillar abscesses.  Eyes:     General: Lids are normal. Gaze aligned appropriately.     Extraocular Movements: Extraocular movements intact.  Cardiovascular:     Rate and Rhythm: Normal rate and regular rhythm.     Heart sounds: Normal heart sounds.  Pulmonary:     Effort: Pulmonary effort is normal.     Breath sounds: Normal breath sounds. No decreased air movement. No decreased breath sounds, wheezing, rhonchi or rales.  Musculoskeletal:     Cervical back: Normal range of motion and neck supple.  Lymphadenopathy:     Head:     Right side of head: No submental, submandibular or preauricular adenopathy.     Left side of head: No submental, submandibular or preauricular adenopathy.     Cervical:     Right cervical: No superficial cervical adenopathy.    Left cervical: No superficial cervical adenopathy.     Upper Body:     Right upper body: No supraclavicular adenopathy.     Left upper body: No supraclavicular adenopathy.  Skin:    General: Skin is warm and dry.  Neurological:     General: No focal deficit present.     Mental Status: He is alert and oriented to person, place, and time.  Psychiatric:        Mood and Affect: Mood normal.        Behavior: Behavior normal. Behavior is cooperative.        Thought Content: Thought content normal.        Judgment: Judgment normal.      UC Treatments / Results  Labs (all labs ordered are listed, but only abnormal results are displayed) Labs Reviewed  POCT INFLUENZA  A/B    EKG   Radiology No results found.  Procedures Procedures (including critical care time)  Medications Ordered in UC Medications - No data to display  Initial Impression / Assessment and Plan / UC Course  I have reviewed the triage vital signs and the nursing notes.  Pertinent labs & imaging results that were available during my care of the patient were reviewed by me and considered in my medical decision making (see chart for details).      Final Clinical Impressions(s) / UC Diagnoses   Final diagnoses:  Influenza  Influenza-like illness  Acute, new concern Patient presents today for concerns for body aches, fatigue, diaphoresis since Monday.  He reports that his daughter recently tested positive for the flu.  Rapid flu and COVID testing were negative.  Suspect that he may be a little too early to test positive for the flu at this time.  Reviewed management options including Tamiflu.  He is amenable to starting this.  Reviewed side effects and mechanism of action.  Will send in Tamiflu p.o. twice daily x 5 days.  Recommend over-the-counter medications for further symptomatic relief.  ED and return precautions reviewed and provided in after visit summary.  Follow-up as needed for progressing or persistent symptoms    Discharge Instructions      I suspect that you likely have the flu even though your testing was negative.   I have sent in a script for Tamiflu to treat this as you are still in the therapeutic window. Please start it as soon as possible for maximum benefit and resolution of symptoms.   It can take a while for the antiviral to kick in so I recommend symptomatic relief with over the counter medication such as the following: Dayquil/ Nyquil Theraflu Alkaseltzer  These medications typically have Tylenol in them already so you can take Ibuprofen as needed for further pain and discomfort and fever management   If you have high blood pressure I recommend  taking regular formulation Mucinex and Robitussin and Tylenol rather than a combination medication.   Stay well hydrated with at least 75 oz of water per day to help with recovery  If you notice any of the following please go to the ED: increased fever not responding to Tylenol or Ibuprofen, swelling around your nose or eyes, difficulty seeing, shortness of breath, trouble breathing, fatigue, dehydration.       ED Prescriptions     Medication Sig Dispense Auth. Provider   oseltamivir (TAMIFLU) 75 MG capsule Take 1 capsule (75 mg total) by mouth 2 (two) times daily for 5 days. 10 capsule Fantasha Daniele E, PA-C      PDMP not reviewed this encounter.   Providence Crosby, PA-C 06/28/23 1539    Matea Stanard, Oswaldo Conroy, PA-C 06/28/23 1552

## 2023-06-28 NOTE — Discharge Instructions (Addendum)
I suspect that you likely have the flu even though your testing was negative.   I have sent in a script for Tamiflu to treat this as you are still in the therapeutic window. Please start it as soon as possible for maximum benefit and resolution of symptoms.   It can take a while for the antiviral to kick in so I recommend symptomatic relief with over the counter medication such as the following: Dayquil/ Nyquil Theraflu Alkaseltzer  These medications typically have Tylenol in them already so you can take Ibuprofen as needed for further pain and discomfort and fever management   If you have high blood pressure I recommend taking regular formulation Mucinex and Robitussin and Tylenol rather than a combination medication.   Stay well hydrated with at least 75 oz of water per day to help with recovery  If you notice any of the following please go to the ED: increased fever not responding to Tylenol or Ibuprofen, swelling around your nose or eyes, difficulty seeing, shortness of breath, trouble breathing, fatigue, dehydration.

## 2023-09-20 ENCOUNTER — Encounter (HOSPITAL_COMMUNITY): Payer: Self-pay

## 2023-09-20 ENCOUNTER — Ambulatory Visit (HOSPITAL_COMMUNITY)
Admission: EM | Admit: 2023-09-20 | Discharge: 2023-09-20 | Disposition: A | Discharge status: Home / Self Care | Payer: Self-pay | Attending: Emergency Medicine | Admitting: Emergency Medicine

## 2023-09-20 DIAGNOSIS — J069 Acute upper respiratory infection, unspecified: Secondary | ICD-10-CM

## 2023-09-20 LAB — POC COVID19/FLU A&B COMBO
Covid Antigen, POC: NEGATIVE
Influenza A Antigen, POC: NEGATIVE
Influenza B Antigen, POC: NEGATIVE

## 2023-09-20 NOTE — Discharge Instructions (Signed)
 COVID and flu testing were negative, EMS likely have a different viral illness.  For body aches, fever or fatigue you can alternate between 800 mg of ibuprofen  and 500 mg of Tylenol .  Ensure you are staying well-hydrated with at least 64 ounces of water daily.  Sleeping with the humidifier may help with any sore throat.  Over-the-counter Delsym or cough drops can help with cough.  Viral illnesses typically last 5 to 7 days in duration, if no improvement or any changes return to clinic for reevaluation.

## 2023-09-20 NOTE — ED Triage Notes (Signed)
 Fatigue and body aches started last night. Patient will need a work note.

## 2023-09-20 NOTE — ED Provider Notes (Signed)
 [facility]    CSN: [id number] Arrival date & time: 09/20/23  1628      History   Chief Complaint Chief Complaint  Patient presents with   Fatigue   Generalized Body Aches   Cough    HPI Harry Barber is a 38 y.o. male.   Patient presents to clinic over concern of fatigue, body aches and a dry cough that started last night while he was at work.  He did go home early due to how he was feeling.  Has had some nausea.  Without vomiting.  Does not think he has had a fever.  Without congestion or rhinorrhea.  Has not tried any medications or interventions.  Has not had any shortness of breath, wheezing or chest pain.  Review he recently lost his younger sister around a week or so ago, so he might be grieving from this as well.  The history is provided by the patient and medical records.  Cough   Past Medical History:  Diagnosis Date   PPD positive, treated    7/208 upon hire at [facility]; received 9 months isoniazid+vit B6 therapy   Scabies     Patient Active Problem List   Diagnosis Date Noted   Acute pain of left shoulder 03/16/2017   Cervicalgia 03/16/2017   BROKEN TOOTH 02/17/2009   TOBACCO USER 07/21/2008   ECZEMA 07/21/2008   DYSURIA 07/21/2008    History reviewed. No pertinent surgical history.     Home Medications    Prior to Admission medications   Not on File    Family History History reviewed. No pertinent family history.  Social History Social History   Tobacco Use   Smoking status: Former    Types: Cigarettes   Smokeless tobacco: Never   Tobacco comments:    black n mild  Vaping Use   Vaping status: Never Used  Substance Use Topics   Alcohol use: Yes    Comment: occasional   Drug use: No     Allergies   Chocolate   Review of Systems Review of Systems  Per HPI  Physical Exam Triage Vital Signs ED Triage Vitals [09/20/23 1722]  Encounter Vitals Group     BP 135/80     Systolic BP Percentile      Diastolic  BP Percentile      Pulse Rate 66     Resp 17     Temp 98.8 F (37.1 C)     Temp Source Oral     SpO2 96 %     Weight      Height      Head Circumference      Peak Flow      Pain Score      Pain Loc      Pain Education      Exclude from Growth Chart    No data found.  Updated Vital Signs BP 135/80 (BP Location: Left Arm)   Pulse 66   Temp 98.8 F (37.1 C) (Oral)   Resp 17   SpO2 96%   Visual Acuity Right Eye Distance:   Left Eye Distance:   Bilateral Distance:    Right Eye Near:   Left Eye Near:    Bilateral Near:     Physical Exam Vitals and nursing note reviewed.  Constitutional:      Appearance: Normal appearance.  HENT:     Head: Normocephalic and atraumatic.     Right Ear: External ear normal.  Left Ear: External ear normal.     Nose: Nose normal.     Mouth/Throat:     Mouth: Mucous membranes are moist.  Eyes:     Conjunctiva/sclera: Conjunctivae normal.  Cardiovascular:     Rate and Rhythm: Normal rate and regular rhythm.     Heart sounds: Normal heart sounds. No murmur heard. Pulmonary:     Effort: Pulmonary effort is normal. No respiratory distress.     Breath sounds: Normal breath sounds.  Skin:    General: Skin is warm and dry.  Neurological:     General: No focal deficit present.     Mental Status: He is alert.  Psychiatric:        Mood and Affect: Mood normal.      UC Treatments / Results  Labs (all labs ordered are listed, but only abnormal results are displayed) Labs Reviewed  POC COVID19/FLU A&B COMBO    EKG   Radiology No results found.  Procedures Procedures (including critical care time)  Medications Ordered in UC Medications - No data to display  Initial Impression / Assessment and Plan / UC Course  I have reviewed the triage vital signs and the nursing notes.  Pertinent labs & imaging results that were available during my care of the patient were reviewed by me and considered in my medical decision making  (see chart for details).  Vitals in triage reviewed, patient is hemodynamically stable.  Lungs are vesicular, heart with regular rate and rhythm.  There is nasal drip present on physical exam.  POC COVID and flu testing negative, suspect other viral URI.  Symptomatic management discussed.  Work note provided.  Plan of care, follow-up care return precautions given, no questions at this time.    Final Clinical Impressions(s) / UC Diagnoses   Final diagnoses:  Viral URI with cough     Discharge Instructions      COVID and flu testing were negative, EMS likely have a different viral illness.  For body aches, fever or fatigue you can alternate between 800 mg of ibuprofen  and 500 mg of Tylenol .  Ensure you are staying well-hydrated with at least 64 ounces of water daily.  Sleeping with the humidifier may help with any sore throat.  Over-the-counter Delsym or cough drops can help with cough.  Viral illnesses typically last 5 to 7 days in duration, if no improvement or any changes return to clinic for reevaluation.     ED Prescriptions   None    PDMP not reviewed this encounter.   Awilda Bogus, FNP 09/20/23 [phone]

## 2024-02-08 ENCOUNTER — Other Ambulatory Visit: Payer: Self-pay | Admitting: Emergency Medicine

## 2024-02-08 DIAGNOSIS — M25512 Pain in left shoulder: Secondary | ICD-10-CM

## 2024-02-08 DIAGNOSIS — M25552 Pain in left hip: Secondary | ICD-10-CM
